# Patient Record
Sex: Female | Born: 1960 | Race: Black or African American | Hispanic: No | Marital: Married | Smoking: Never smoker
Health system: Southern US, Community
[De-identification: ages and names within clinical notes are randomized; demographics above are authoritative.]

## PROBLEM LIST (undated history)

## (undated) DIAGNOSIS — K219 Gastro-esophageal reflux disease without esophagitis: Secondary | ICD-10-CM

## (undated) DIAGNOSIS — F419 Anxiety disorder, unspecified: Secondary | ICD-10-CM

## (undated) DIAGNOSIS — E119 Type 2 diabetes mellitus without complications: Secondary | ICD-10-CM

## (undated) DIAGNOSIS — G4730 Sleep apnea, unspecified: Secondary | ICD-10-CM

## (undated) DIAGNOSIS — I10 Essential (primary) hypertension: Secondary | ICD-10-CM

## (undated) HISTORY — PX: ABDOMINAL HYSTERECTOMY: SHX81

## (undated) HISTORY — DX: Type 2 diabetes mellitus without complications: E11.9

## (undated) HISTORY — PX: ANTERIOR CRUCIATE LIGAMENT REPAIR: SHX115

## (undated) HISTORY — DX: Sleep apnea, unspecified: G47.30

## (undated) HISTORY — DX: Anxiety disorder, unspecified: F41.9

## (undated) HISTORY — DX: Essential (primary) hypertension: I10

## (undated) HISTORY — DX: Gastro-esophageal reflux disease without esophagitis: K21.9

---

## 1999-03-24 ENCOUNTER — Other Ambulatory Visit: Admission: RE | Admit: 1999-03-24 | Discharge: 1999-03-24 | Payer: Self-pay | Admitting: Gynecology

## 2000-02-08 ENCOUNTER — Emergency Department (HOSPITAL_COMMUNITY): Admission: EM | Admit: 2000-02-08 | Discharge: 2000-02-09 | Payer: Self-pay | Admitting: *Deleted

## 2000-03-30 ENCOUNTER — Other Ambulatory Visit: Admission: RE | Admit: 2000-03-30 | Discharge: 2000-03-30 | Payer: Self-pay | Admitting: Gynecology

## 2000-04-20 ENCOUNTER — Encounter (INDEPENDENT_AMBULATORY_CARE_PROVIDER_SITE_OTHER): Payer: Self-pay

## 2000-04-20 ENCOUNTER — Other Ambulatory Visit: Admission: RE | Admit: 2000-04-20 | Discharge: 2000-04-20 | Payer: Self-pay | Admitting: Gynecology

## 2001-03-30 ENCOUNTER — Other Ambulatory Visit: Admission: RE | Admit: 2001-03-30 | Discharge: 2001-03-30 | Payer: Self-pay | Admitting: Gynecology

## 2001-04-10 ENCOUNTER — Encounter (INDEPENDENT_AMBULATORY_CARE_PROVIDER_SITE_OTHER): Payer: Self-pay | Admitting: Specialist

## 2001-04-10 ENCOUNTER — Ambulatory Visit (HOSPITAL_COMMUNITY): Admission: RE | Admit: 2001-04-10 | Discharge: 2001-04-10 | Payer: Self-pay | Admitting: Gynecology

## 2002-01-12 ENCOUNTER — Ambulatory Visit (HOSPITAL_BASED_OUTPATIENT_CLINIC_OR_DEPARTMENT_OTHER): Admission: RE | Admit: 2002-01-12 | Discharge: 2002-01-12 | Payer: Self-pay | Admitting: *Deleted

## 2002-04-09 ENCOUNTER — Other Ambulatory Visit: Admission: RE | Admit: 2002-04-09 | Discharge: 2002-04-09 | Payer: Self-pay | Admitting: Family Medicine

## 2002-06-27 ENCOUNTER — Encounter (INDEPENDENT_AMBULATORY_CARE_PROVIDER_SITE_OTHER): Payer: Self-pay

## 2002-06-28 ENCOUNTER — Inpatient Hospital Stay (HOSPITAL_COMMUNITY): Admission: AD | Admit: 2002-06-28 | Discharge: 2002-06-30 | Payer: Self-pay | Admitting: Obstetrics and Gynecology

## 2003-05-03 ENCOUNTER — Other Ambulatory Visit: Admission: RE | Admit: 2003-05-03 | Discharge: 2003-05-03 | Payer: Self-pay | Admitting: Family Medicine

## 2004-12-22 ENCOUNTER — Ambulatory Visit (HOSPITAL_COMMUNITY): Admission: RE | Admit: 2004-12-22 | Discharge: 2004-12-22 | Payer: Self-pay | Admitting: *Deleted

## 2004-12-31 ENCOUNTER — Encounter: Admission: RE | Admit: 2004-12-31 | Discharge: 2005-03-31 | Payer: Self-pay | Admitting: *Deleted

## 2005-01-08 ENCOUNTER — Ambulatory Visit (HOSPITAL_COMMUNITY): Admission: RE | Admit: 2005-01-08 | Discharge: 2005-01-08 | Payer: Self-pay | Admitting: *Deleted

## 2005-02-26 ENCOUNTER — Ambulatory Visit (HOSPITAL_COMMUNITY): Admission: RE | Admit: 2005-02-26 | Discharge: 2005-02-26 | Payer: Self-pay | Admitting: *Deleted

## 2005-04-14 ENCOUNTER — Ambulatory Visit: Payer: Self-pay | Admitting: Pulmonary Disease

## 2005-05-31 HISTORY — PX: OTHER SURGICAL HISTORY: SHX169

## 2005-07-15 ENCOUNTER — Encounter: Admission: RE | Admit: 2005-07-15 | Discharge: 2005-10-13 | Payer: Self-pay | Admitting: *Deleted

## 2005-08-03 ENCOUNTER — Observation Stay (HOSPITAL_COMMUNITY): Admission: RE | Admit: 2005-08-03 | Discharge: 2005-08-06 | Payer: Self-pay | Admitting: *Deleted

## 2005-10-28 ENCOUNTER — Encounter: Admission: RE | Admit: 2005-10-28 | Discharge: 2005-10-28 | Payer: Self-pay | Admitting: *Deleted

## 2005-12-13 ENCOUNTER — Encounter: Admission: RE | Admit: 2005-12-13 | Discharge: 2006-03-13 | Payer: Self-pay | Admitting: *Deleted

## 2006-03-03 ENCOUNTER — Other Ambulatory Visit: Admission: RE | Admit: 2006-03-03 | Discharge: 2006-03-03 | Payer: Self-pay | Admitting: Family Medicine

## 2006-03-08 ENCOUNTER — Encounter: Admission: RE | Admit: 2006-03-08 | Discharge: 2006-06-06 | Payer: Self-pay | Admitting: *Deleted

## 2006-08-02 ENCOUNTER — Encounter: Admission: RE | Admit: 2006-08-02 | Discharge: 2006-10-31 | Payer: Self-pay | Admitting: *Deleted

## 2006-11-21 ENCOUNTER — Emergency Department (HOSPITAL_COMMUNITY): Admission: EM | Admit: 2006-11-21 | Discharge: 2006-11-21 | Payer: Self-pay | Admitting: Emergency Medicine

## 2007-04-03 ENCOUNTER — Other Ambulatory Visit: Admission: RE | Admit: 2007-04-03 | Discharge: 2007-04-03 | Payer: Self-pay | Admitting: Family Medicine

## 2007-05-11 ENCOUNTER — Encounter: Admission: RE | Admit: 2007-05-11 | Discharge: 2007-05-11 | Payer: Self-pay | Admitting: Family Medicine

## 2008-05-13 ENCOUNTER — Encounter: Admission: RE | Admit: 2008-05-13 | Discharge: 2008-05-13 | Payer: Self-pay | Admitting: Family Medicine

## 2010-04-29 ENCOUNTER — Encounter: Admission: RE | Admit: 2010-04-29 | Discharge: 2010-04-29 | Payer: Self-pay | Admitting: Family Medicine

## 2010-06-21 ENCOUNTER — Encounter: Payer: Self-pay | Admitting: Family Medicine

## 2010-10-16 NOTE — Discharge Summary (Signed)
[name], [name]                         ACCOUNT NO.:  [id number]   MEDICAL RECORD NO.:  [id number]                   PATIENT TYPE:  INP   LOCATION:  9311                                 FACILITY:  [facility]   PHYSICIAN:  Maxie Better, M.D.            DATE OF BIRTH:  [birth year]   DATE OF ADMISSION:  06/27/2002  DATE OF DISCHARGE:  06/30/2002                                 DISCHARGE SUMMARY   ADMISSION DIAGNOSES:  1. Severe dysmenorrhea.  2. Dysfunctional uterine bleeding.   DISCHARGE DIAGNOSES:  1. Uterine fibroids.  2. Dysfunctional uterine bleeding.  3. Endometrial polyps.  4. Severe dysmenorrhea.  5. Iron-deficiency anemia.   PROCEDURE:  Examination under anesthesia, diagnostic laparoscopy,  exploratory laparotomy, total abdominal hysterectomy.   HISTORY OF PRESENT ILLNESS:  A 50 year old G0 female with severe  dysmenorrhea and dysfunctional uterine bleeding who was admitted for  possible total abdominal hysterectomy, possible laparoscopic assisted  vaginal hysterectomy.  The patient's history is notable for multiple medical  problems.  She has also had a hysteroscopic resection of submucosal fibroid  in November of 2002.   HOSPITAL COURSE:  The patient was admitted to [facility].  She was  taken to the operating room where examination under anesthesia was  performed.  Diagnostic laparoscopy was done.  Uterus was about eight weeks'  size, anteverted, irregular with descensus.  Right and left tubes were  normal.  Left ovary with a small cyst, right ovary was normal.  Normal  appendix, normal liver edge.  Decision was made to proceed with abdominal  route.  Therefore, the patient underwent a total abdominal hysterectomy  with preservation of ovaries.  Final pathology showed cervix without any  pathological abnormalities, benign secretory endometrial polyps,  leiomyomata, uterus was 175 g.   The patient's postoperative course was notable for being afebrile,  slowing  of bowel function, subsequent passage of flatus after suppository.   By postoperative day #3, the patient felt well to go home.  She had been  tolerating a regular diet, voiding without difficulty.  Her incision showed  no erythema, induration or exudate.  Her CBC on postoperative day #1 showed  hemoglobin 8.3, preoperative hemoglobin was 9.5, platelet count was 305,000,  white count 11.3.   DISPOSITION:  Home.   CONDITION ON DISCHARGE:  Stable.   DISCHARGE MEDICATIONS:  1. Dilaudid 2 mg one to two tablets every two to four hours p.r.n. pain.  2. Motrin 800 mg p.o. q.6-8h. p.r.n. pain.  3. Over-the-counter iron supplement one p.o. b.i.d.   FOLLOW UP:  A follow-up appointment is for staple removal on Wednesday, and  will follow up six weeks postoperatively at [facility].   DISCHARGE INSTRUCTIONS:  Nothing per vagina for four to six weeks.  No  strenuous bowel movements.  Call for temperature greater than or equal to  100.4.  Call if increased incisional appearing redness or drainage from  incision site,  severe abdominal pain, nausea or vomiting.  No heavy lifting  or driving for two weeks.  No added salt diet.                                               Maxie Better, M.D.    [place]/MEDQ  D:  07/30/2002  T:  07/30/2002  Job:  [id number]

## 2010-10-16 NOTE — Discharge Summary (Signed)
[name], [name]               ACCOUNT NO.:  [id number]   MEDICAL RECORD NO.:  [id number]           PATIENT TYPE:   LOCATION:                               FACILITY:  [facility]   PHYSICIAN:  Alfonse Ras, MD   DATE OF BIRTH:  August 26, 1960   DATE OF ADMISSION:  08/06/2005  DATE OF DISCHARGE:  08/09/2005                                 DISCHARGE SUMMARY   ADMISSION DIAGNOSIS:  Morbid obesity.   DISCHARGE DIAGNOSIS:  Morbid obesity.   CONDITION ON DISCHARGE:  Good and improved.   DISPOSITION:  Discharged to home.   MEDICATIONS:  Roxicet elixir at the time of discharge.   HOSPITAL COURSE:  The patient was brought to the hospital after having home  bowel prep and underwent laparoscopic Roux-en-Y gastric bypass.  She did  well and was watched in the step-down unit on CPAP.  Her swallow on  postoperative day #1 showed no evidence of leak and good flow through the  anastomosis.  She was transferred to the floor and started on a diet.  She  had some difficulty with urination on postoperative day #2 which resolved by  the following day and she was ready for discharge home.      Alfonse Ras, MD  Electronically Signed     KRE/MEDQ  D:  08/27/2005  T:  08/30/2005  Job:  [id number]

## 2010-10-16 NOTE — H&P (Signed)
[facility]  Patient:    [name], [name] Visit Number: [id number] MRN: [id number]          Service Type: Attending:  Nadyne Coombes. Fontaine, M.D. Dictated by:   Nadyne Coombes. Fontaine, M.D. Adm. Date:  04/10/01                           History and Physical  CHIEF COMPLAINT:              Irregular bleeding.  HISTORY OF PRESENT ILLNESS:   A 50 year old G0 with history of irregular menses over the last several months.  Patient does have history of prior simple hyperplasia of the endometrium with follow-up negative biopsy.  Current evaluation includes a normal prolactin, normal TSH, a CBC with a hematocrit of 33, and an ultrasound which shows small myomas, right and left ovaries grossly normal; attempted sonohysterogram unable to achieve due to inability to place the catheter.  The patient is admitted at this time for hysteroscopy and D&C with possible resection of any intracavitary defects.  PAST MEDICAL HISTORY:         Includes hypertension and osteoarthritis.  PAST SURGICAL HISTORY:        Includes a uvuloplasty, and a laparoscopy in 1994.  ALLERGIES:                    SULFA.  REVIEW OF SYSTEMS:            Noncontributory.  SOCIAL HISTORY:               Noncontributory.  FAMILY HISTORY:               Noncontributory.  CURRENT MEDICATIONS:          1. Monopril 5 mg.                               2. Nasacort two puffs each nostril.                               3. Clarinex one p.o. q.d.                               4. Sarafem 10 mg p.o. q.d.                               5. Fibercon p.r.n.  ADMISSION PHYSICAL EXAMINATION:  VITAL SIGNS:                  Afebrile, vital signs are stable.  HEENT:                        Normal.  LUNGS:                        Clear.  CARDIAC:                      Regular rate without rubs, murmurs, or gallops.  ABDOMEN:                      Benign.  PELVIC:  External, BUS, vagina  normal.  Cervix grossly normal.  Uterus normal size, nontender.  Adnexa without masses or tenderness.  ASSESSMENT:                   A 50 year old G0 with history of irregular menses, past history of endometrial hyperplasia, follow-up biopsy in 2001 benign secretory endometrium with several-month history now of irregular menses.  Ultrasound shows some distortion from submucous myomas, question intracavitary versus intramural.  Attempts at sonohysterogram unsuccessful and patient is admitted now for hysteroscopy D&C, possible resection of any intracavitary defects.  The risks, benefits, indications, and alternatives were reviewed with the patient to include review of hysteroscopy D&C with the expected intraoperative/postoperative courses.  The risks of infection; bleeding; hemorrhage; transfusion; perforation; damage to internal organs including bowel, bladder, ureters, vessels, and nerves necessitating major exploratory reparative surgeries and future reparative surgeries including ostomy formation was all discussed with her.  The patients questions are answered to her satisfaction and she is ready to proceed with surgery. Dictated by:   Nadyne Coombes. Fontaine, M.D. Attending:  Nadyne Coombes. Fontaine, M.D. DD:  04/07/01 TD:  04/07/01 Job: [id number]

## 2010-10-16 NOTE — Op Note (Signed)
[name], [name]               ACCOUNT NO.:  [id number]   MEDICAL RECORD NO.:  [id number]          PATIENT TYPE:  INP   LOCATION:  0151                         FACILITY:  [facility]   PHYSICIAN:  Alfonse Ras, MD   DATE OF BIRTH:  December 31, 1960   DATE OF PROCEDURE:  08/03/2005  DATE OF DISCHARGE:                                 OPERATIVE REPORT   PREOPERATIVE DIAGNOSIS:  Morbid obesity, obstructive sleep apnea,  gastroesophageal reflux disease, and hypertension.   POSTOPERATIVE DIAGNOSIS:  Morbid obesity, obstructive sleep apnea,  gastroesophageal reflux disease, and hypertension.   PROCEDURE:  Laparoscopic Roux-en-Y gastric bypass, left facing Roux limb.   SURGEON:  Alfonse Ras, MD.   ASSISTANT:  Wenda Low, MD.   ANESTHESIA:  General.   DESCRIPTION OF PROCEDURE:  The patient was taken to the operating room,  placed in a supine position and after adequate general anesthesia was  induced using an endotracheal tube, the abdomen was prepped and prepped and  draped in the normal sterile fashion. Using a 12 mm Optiview in the left  upper quadrant under direct vision, peritoneal access was obtained.  Pneumoperitoneum was obtained. Additional 12 and 5 mm trocars were placed  throughout the abdomen. There was a few omental adhesions which were taken  down with the harmonic scalpel to the anterior abdominal wall. The ligament  of Treitz was identified and 40 cm distal to that it was transected using a  white load GIA stapling device. The distal limb was tagged with a Penrose  drain and then counting 100 cm further a side-to-side jejunojejunostomy was  performed in the standard fashion using a GIA 45 mm load. The enterotomy was  closed with a running 2-0 Vicryl suture. It was inspected and then  reinforced with Tisseel. The mesenteric defect was closed with a running 2-0  silk suture.   I then turned my attention to the upper abdomen and the patient was placed  in reverse  Trendelenburg. A Nathanson liver retractor was then placed and  the left lateral segment of the liver was retracted anteriorly. The angle of  Hiss was bluntly and sharply dissected. An area on the lesser curve  approximately 4 cm distal to the EG junction was identified and using sharp  and blunt dissection the lesser sac was entered. Using serial firings of the  first gray and then blue load 60 mm GIA the pouch was created. Ewald was  placed down through the EG junction. Satisfied with the pouch, I then  oversewed the staple line of the remnant with a running locking 2-0 silk  suture. The Roux limb was then brought up in a left facing position without  any tension. A side-to-side gastrojejunostomy was performed in the standard  fashion using a 45 mm GIA blue load stapling device. The posterior layer had  previously been placed with a 2-0 Vicryl suture. The enterotomy and  gastrotomy were closed with a running 2-0 Vicryl suture and an anterior  serosal layer was then accomplished with a 2-0 Vicryl. The Roux limb was  then clamped and upper abdomen was  irrigated. Upper endoscopy is performed  by Dr. Daphine Deutscher which showed no evidence of leak in patency of the  anastomosis. This will be dictated in a separate report. The [facility] liver  retractor was removed. The staple line was reinforced with Tisseel. Trocars  were removed. Pneumoperitoneum was released. The skin incisions were closed  with staples. The patient tolerated the procedure well and went to PACU in  good condition.      Alfonse Ras, MD  Electronically Signed     KRE/MEDQ  D:  08/03/2005  T:  08/04/2005  Job:  [id number]

## 2010-10-16 NOTE — Op Note (Signed)
[name], [name]               ACCOUNT NO.:  [id number]   MEDICAL RECORD NO.:  [id number]          PATIENT TYPE:  INP   LOCATION:  0151                         FACILITY:  [facility]   PHYSICIAN:  Thornton Park. Daphine Deutscher, MD  DATE OF BIRTH:  30-May-1961   DATE OF PROCEDURE:  08/03/2005  DATE OF DISCHARGE:                                 OPERATIVE REPORT   PROCEDURE:  Upper endoscopy at the completion of laparoscopic Roux-en-Y  gastric bypass.  Endoscopist:  Wenda Low, MD, FACS   This 50 year old black female had undergone the aforementioned operation. I  passed the endoscope from above without difficulty. I measured the  esophagogastric junction at approximately the 37-38 cm and the anastomosis  was identified to be a pouch of about 4-5 cm. I entered the pouch and saw no  evidence of bleeding. I insufflated and she clamped while she was observing  on the inside and no evidence of bubbles or leak. I then took some pictures  and then decompressed the pouch and withdrew the scope. No apparent  complications.      Thornton Park Daphine Deutscher, MD  Electronically Signed     MBM/MEDQ  D:  08/03/2005  T:  08/04/2005  Job:  [id number]

## 2010-10-16 NOTE — Op Note (Signed)
[facility]  Patient:    [name], [name] Visit Number: [id number] MRN: [id number]          Service Type: DSU Location: [facility] Attending Physician:  Merrily Pew Proc. Date: 04/10/01 Admit Date:  04/10/2001                             Operative Report  PREOPERATIVE DIAGNOSES:       1. Menorrhagia.                               2. Leiomyomata.  POSTOPERATIVE DIAGNOSIS:      Submucous myoma.  PROCEDURE:                    Hysteroscopic myomectomy, D&C.  SURGEON:                      Timothy P. Fontaine, M.D.  ANESTHESIA:                   General.  SPECIMENS:                    1. Submucous myoma fragments.                               2. EMC:  ESTIMATED BLOOD LOSS:         Minimal.  COMPLICATIONS:                None.  SORBITOL DISCREPANCY:         Approximately 100-150 cc.  FINDINGS:                     Submucous myoma right lateral uterine wall, resected to the level of the endometrium.  D&C performed.  Hysteroscopy otherwise normal, noting right and left tubal ostia.  Fundus, anteroposterior uterine surfaces, lower uterine segment, and the cervical canal all inspected.  DESCRIPTION OF PROCEDURE:     The patient was taken to the operating room and underwent general anesthesia, was placed in low dorsal lithotomy position, received a perineal and vaginal preparation with Betadine solution.  Bladder emptied with in-and-out Foley catheterization, the patient draped in the usual fashion.  Cervix was visualized and grasped with a single-tooth tenaculum, gently dilated to admit the operative hysteroscope, and hysteroscopy was performed with findings noted above.  Through repetitive passes with the right angle resectoscopic loop, the myoma was resected to the level of the surrounding endometrium.  A sharp curettage was then performed. Re-hysteroscopy showed good distension, a normal-appearing cavity, no evidence of perforation.  The  instruments were all removed.  Adequate hemostasis visualized, the patient placed in supine position, awakened without difficulty, and taken to recovery room in good condition having tolerated the procedure well.  The sorbitol discrepancy was approximately 100-150 cc accounting for some sorbitol on the floor.  Note that during the procedure and use of the tenaculum, there was little or no descent of the uterus with traction. Attending Physician:  Merrily Pew DD:  04/10/01 TD:  04/10/01 Job: [id number]

## 2010-10-16 NOTE — Op Note (Signed)
[name], [name]                         ACCOUNT NO.:  [id number]   MEDICAL RECORD NO.:  [id number]                   PATIENT TYPE:  OBV   LOCATION:  9311                                 FACILITY:  [facility]   PHYSICIAN:  Maxie Better, M.D.            DATE OF BIRTH:  30-May-1961   DATE OF PROCEDURE:  06/27/2002  DATE OF DISCHARGE:                                 OPERATIVE REPORT   PREOPERATIVE DIAGNOSIS:  Severe dysmenorrhea and dysfunctional uterine  bleeding.   POSTOPERATIVE DIAGNOSIS:  Severe dysmenorrhea, dysfunctional uterine  bleeding, uterine fibroids.   PROCEDURE:  Examination under anesthesia, diagnostic laparoscopy,  exploratory laparotomy, total abdominal hysterectomy.   ANESTHESIA:  General.   SURGEON:  Maxie Better, M.D.   ASSISTANT:  Cordelia Pen A. Rosalio Macadamia, M.D.   INDICATIONS:  This is a 50 year old gravida 0 female.  Her last menstrual  period was 05/13/02 with severe dysmenorrhea and dysfunctional uterine  bleeding, who now presents for definitive surgical management.  The risks  and benefits of the procedure had been explained to the patient.  Consent  was signed, and the patient was transferred to the operating room.   PROCEDURE:  Under adequate general anesthesia, the patient was placed in the  dorsolithotomy position.  Examination under anesthesia revealed an eight-  week size, anteverted uterus.  No adnexal masses could be appreciated.  The  patient was sterilely prepped and draped in the usual fashion.  An  indwelling Foley catheter was sterilely placed.  A bivalved speculum was  placed in the vagina.  A single-tooth tenaculum was placed on the anterior  lip of the cervix.  An acorn cannula was introduced into the cervical os and  attached to the tenaculum for manipulation of the uterus.  Attention was  then turned to the abdomen where 0.25% Marcaine was injected  infraumbilically.  An infraumbilical incision was then made.  The Veress  needle  was introduced into the abdominal cavity.  The patient was tested  with normal saline.  Opening pressure of 11 was noted.  2.5 liters of carbon  dioxide were insufflated.  The Veress needle was removed.  A 10 mm  disposable trocar was introduced into the abdominal cavity without incident.  A lighted video laparoscope was introduced through that port.  The patient  was then placed in Trendelenburg, and inspection of the abdomen and pelvis  was done.  A normal appendix was noted.  A normal liver was noted.  A  suprapubic incision was then made, and under direct visualization, a 5 mm  port was introduced.  Through that lower port, a probe was then utilized to  inspect the pelvis.  It was noted that the patient's pelvis did not show any  pelvic endometriosis.  Her uterus was irregular in contour.  In particular,  the posterior aspect of the uterus contained subserosal fibroids.  The right  ovary was normal.  The left ovary had  a small-appearing cyst.  With  manipulation of the uterus and the size of the uterus as noted on  laparoscopy, as well as the inability to show descensus on vaginal exam, a  decision was then made to convert the procedure to a laparotomy.  The  infraumbilical and the suprapubic ports were then removed.  The patient  remained in the dorsolithotomy position, and using continued sterile  technique, a Pfannenstiel skin incision was made after a marking pen was  utilized to outline the planned incision.  0.25% Marcaine was injected along  the planned line.  A Pfannenstiel skin incision was then made and carried  onto the rectus fascia.  The rectus fascia was incised in the midline and  extended transversely.  The rectus fascia was then bluntly and with cautery  dissected off the rectus muscle in a superior and inferior fashion. The  rectus muscle was split in the midline.  The parietal peritoneum was entered  bluntly and extended superiorly and inferiorly.  Small bleeders along  the  path were cauterized.  A self-retaining retractor was then placed. The  bowels were packed upwardly.  Using long Kelly's, the utero-ovarian  ligaments were bilaterally clamped for traction on the uterus.  The right  round ligament was brought into the field.  A 0 Vicryl figure-of-eight  suture was then made around the right round ligament.  The right round  ligament was severed using cautery.  The anterior leaf of the broad ligament  was opened.  The posterior leaf of the broad ligament was then opened.  The  right utero-ovarian ligament was doubly clamped, cut, suture ligated, and  free tied with 0 Vicryl suture.  This same procedure was performed on the  contralateral side with the round ligament and the left utero-ovarian  ligament.  The anterior leaf of the broad ligament was opened on the left,  and the vesicouterine peritoneum was developed.  The bladder was then  bluntly dissected off the lower uterine segment.  A self-retaining Balfour  retractor, which had been placed had included a bladder retractor.  The  uterine vessels were bilaterally skeletonized, clamped bilaterally, cut, and  suture ligated with 0 Vicryl.  The fundus of the uterus was then severed  from its cervical attachment.  The cervix was then grasped and placed on  traction.  The cardinal ligaments were then bilaterally clamped, cut, and  suture ligated with 0 Vicryl.  The utero-ovarian ligaments were bilaterally  clamped, cut, and suture ligated with 0 Vicryl.  This was continued until  the cervicovaginal junction was then reached, at which time the cervix was  then circumferentially removed from its vaginal attachments.  The vaginal  angles were placed with 0 Vicryl.  The vaginal cuff was reefed with 0 Vicryl  suture and then closed with a mattress placement of 0 Vicryl suture.  The  bladder throughout the procedure continued to be displaced inferiorly. Small bleeding along the left aspect of the bladder was  cauterized.  Inspection of the pedicles was noted for some bleeding along the peritoneal  edge and the retroperitoneal space, which were then cauterized.  The ovarian  pedicles were tied to the round ligaments bilaterally.  The abdomen was  irrigated and suctioned of debris.  Good hemostasis was subsequently noted,  at which time the packings were removed.  The self-retaining retractor was  then subsequently removed.  The rectus muscle was inspected and small  bleeders were cauterized.  The parietal and vesicouterine peritoneum were  now closed.  The undersurface of the rectus fascia was inspected.  Small  bleeders were cauterized.  The rectus fascia was then closed with 0 Vicryl  x2.  The subcutaneous area was irrigated and small bleeders cauterized.  The  skin was approximated using Ethicon staples.  Prior to starting the  abdominal portion of the procedure, the instruments from the vagina had been  removed.  The infraumbilical incision was closed with 4-0 Vicryl suture.  Specimen labeled uterus was sent to pathology.  The estimated blood loss was  200 cc.  Urine output was 225 cc clear yellow urine.  Intraoperative fluid  was 2800 cc crystalloid.  Sponge and instrument counts x2 were correct.  Complications were none.  The patient tolerated the procedure well and was  transferred to the recovery room in stable condition.                                               Maxie Better, M.D.    [place]/MEDQ  D:  06/27/2002  T:  06/28/2002  Job:  [id number]

## 2010-10-16 NOTE — H&P (Signed)
NAME:  [name], POULLARD                         ACCOUNT NO.:  [id number]   MEDICAL RECORD NO.:  [id number]                   PATIENT TYPE:  AMB   LOCATION:  [facility]                                  FACILITY:  [facility]   PHYSICIAN:  Maxie Better, M.D.            DATE OF BIRTH:  1960/11/07   DATE OF PROCEDURE:  DATE OF DISCHARGE:                      STAT - MUST CHANGE TO CORRECT WORK TYPE   CHIEF COMPLAINT:  Dysfunctional uterine bleeding, severe dysmenorrhea.   HISTORY OF PRESENT ILLNESS:  This is a 50 year old G0 married black female  last menstrual period 05/13/02 who has now been admitted for laparoscopic-  assisted vaginal hysterectomy, possible total abdominal hysterectomy,  secondary to severe dysmenorrhea and dysfunctional uterine bleeding. The  patient was a previous patient of Dr. Colin Broach. She has had a long  history of irregular heavy and painful menses, not responsive to birth  control pills or nonsteroidals. The patient had undergone a hysteroscopic  resection of a submucosal fibroid in 11/02 which did not alleviate her  symptoms. 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 She denied any history of pelvic endometriosis. Ultrasound done  at Dr. Reynold Bowen office in 8/02 had shown a 2.6-cm submucosal fibroid, the  left ovary behind the uterus but otherwise normal size. The patient's  endometrial biopsy in 6/99 did show focal simple endometrial hyperplasia.  The patient desires no future attempt at conception. Her cycles have lasted  greater than seven days when they do occur with passive of clots and severe  cramping. Endometrial biopsy done on 06/14/02 showed benign proliferative  endometrium. No hyperplasia seen. Last Pap in 11/03 was normal. The patient  now presents for surgical management of her problem.   ALLERGIES:  Allergies to sulfa, lima beans.   MEDICATIONS:  1. Nexium 40 mg p.o. daily.  2. Sarafem 10 mg p.o.  daily.  3. Lisinopril.   PAST MEDICAL HISTORY:  1. Sleep apnea, uses CPAP.  2. PMS.  3. Gastroesophageal reflux disorder.  4. Chronic hypertension.  5. Exogenous obesity.   PAST SURGICAL HISTORY:  1. Laparoscopy in 1994.  2. D&C, hysteroscopic resection of submucosal fibroids in 11/02.  3. Uvulopalatoplasty.   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   FAMILY HISTORY:  Diabetes in mother and father. Hypertension in mother and  father and brother.   REVIEW OF SYMPTOMS:  Negative except as noted in history of present illness  as well as sinus related symptoms.   PHYSICAL EXAMINATION:  GENERAL:  Well-developed, well-nourished, obese,  black female in no acute distress.  VITAL SIGNS:  Blood pressure 112/70, weight 236 pounds, height 5 foot 2-1/4  inch.  SKIN:  Shows no lesions.  HEENT:  Anicteric sclerae. Pink conjunctivae. Oropharynx negative.  HEART:  Regular rate and rhythm without murmurs.  LUNGS:  Clear to auscultation.  NECK:  Supple. No palpable thyroid.  BREAST:  Soft, nontender,  no palpable mass.  BACK:  No CVA tenderness.  NODES:  No axillary, supraclavicular, or inguinal nodes palpable.  ABDOMEN:  Obese, soft, nontender.  PELVIC EXAM:  Vulva showed no lesions. Vagina showed no discharge or  lesions. Cervix was high in the vault. Uterus was anteverted, deviated to  the left, normal size. Adnexa had no palpable mass but limited by the  patient's body habitus.  RECTAL EXAM:  Deferred.  EXTREMITIES:  No edema.   IMPRESSION:  Severe dysmenorrhea, dysfunctional uterine bleeding. Possible  etiology includes adenomyosis along with uterine fibroids.   PLAN:  Admission. Laparoscopic-assisted vaginal hysterectomy. Possible total  abdominal hysterectomy. Antibiotic prophylaxis. Antiembolic stockings.  Resumption of the CPAP postoperatively. Resumption of her antihypertensive  medicines postoperatively. Risk of the procedure was discussed  with the  patient including but not limited to:  Inability to perform a laparoscopic-  assisted vaginal hysterectomy due to multiple reasons including lack of  descent of the uterus, infection, bleeding, possible need for blood  transfusion, fistula formation, possible loss of ovaries- pros and cons of  preservation of ovarian function was discussed including possible need for  surgery in the future for ovarian disease including ovarian cancer, internal  scar tissue, injury to surrounding organ structures such as the  bladder/bowel/uterine, risks of blood transfusion including but not limited  to acute reaction/ hepatitis transmission/HIV transmission was discussed,  bowel obstruction due to adhesions. Postoperative care and criteria for  discharge were reviewed. All questions were answered.                                               Maxie Better, M.D.    Marcus/MEDQ  D:  06/27/2002  T:  06/27/2002  Job:  [id number]

## 2012-04-03 ENCOUNTER — Other Ambulatory Visit: Payer: Self-pay | Admitting: Family Medicine

## 2012-04-03 DIAGNOSIS — Z1231 Encounter for screening mammogram for malignant neoplasm of breast: Secondary | ICD-10-CM

## 2012-04-28 ENCOUNTER — Ambulatory Visit
Admission: RE | Admit: 2012-04-28 | Discharge: 2012-04-28 | Disposition: A | Discharge status: Home / Self Care | Payer: BC Managed Care – PPO | Source: Ambulatory Visit | Attending: Family Medicine | Admitting: Family Medicine

## 2012-04-28 DIAGNOSIS — Z1231 Encounter for screening mammogram for malignant neoplasm of breast: Secondary | ICD-10-CM

## 2012-09-04 ENCOUNTER — Other Ambulatory Visit: Payer: Self-pay | Admitting: Gastroenterology

## 2012-09-04 DIAGNOSIS — R1013 Epigastric pain: Secondary | ICD-10-CM

## 2012-09-05 ENCOUNTER — Ambulatory Visit
Admission: RE | Admit: 2012-09-05 | Discharge: 2012-09-05 | Disposition: A | Discharge status: Home / Self Care | Payer: BC Managed Care – PPO | Source: Ambulatory Visit | Attending: Gastroenterology | Admitting: Gastroenterology

## 2012-09-05 DIAGNOSIS — R1013 Epigastric pain: Secondary | ICD-10-CM

## 2012-09-07 ENCOUNTER — Other Ambulatory Visit: Payer: Self-pay | Admitting: Gastroenterology

## 2012-09-08 ENCOUNTER — Other Ambulatory Visit: Payer: BC Managed Care – PPO

## 2013-02-12 ENCOUNTER — Other Ambulatory Visit: Payer: Self-pay | Admitting: Family Medicine

## 2013-02-12 ENCOUNTER — Ambulatory Visit
Admission: RE | Admit: 2013-02-12 | Discharge: 2013-02-12 | Disposition: A | Discharge status: Home / Self Care | Payer: BC Managed Care – PPO | Source: Ambulatory Visit | Attending: Family Medicine | Admitting: Family Medicine

## 2013-02-12 DIAGNOSIS — R05 Cough: Secondary | ICD-10-CM

## 2013-12-17 ENCOUNTER — Other Ambulatory Visit: Payer: Self-pay

## 2013-12-17 ENCOUNTER — Ambulatory Visit
Admission: RE | Admit: 2013-12-17 | Discharge: 2013-12-17 | Disposition: A | Discharge status: Home / Self Care | Payer: BC Managed Care – PPO | Source: Ambulatory Visit

## 2013-12-17 DIAGNOSIS — Z1231 Encounter for screening mammogram for malignant neoplasm of breast: Secondary | ICD-10-CM

## 2015-04-21 ENCOUNTER — Encounter: Payer: Self-pay | Admitting: Family Medicine

## 2015-04-21 ENCOUNTER — Ambulatory Visit (INDEPENDENT_AMBULATORY_CARE_PROVIDER_SITE_OTHER): Payer: BC Managed Care – PPO | Admitting: Family Medicine

## 2015-04-21 NOTE — Patient Instructions (Addendum)
1. Eat at least 3 REAL meals and 1-2 snacks per day.  Aim for no more than 5 hours between eating.  Eat breakfast within one hour of getting up.   - A REAL meal includes some protein, some starch, and veg's and/or fruit.    - Set aside some time on the weekend to plan the week's meals and snacks, and to shop as needed.    - Make a list of 7-10 meals that taste good, are relatively quick and easy to prepare, and that meet your nutritional needs.  Use this as a basis for shopping, so you can make one of these meals any time.  Bring your list to your follow-up appointment for review.   2. Obtain twice as many veg's as protein or carbohydrate foods for both lunch and dinner. 3. Make sure there are plenty of good alternatives to the chips and sweets, i.e., unsalted nuts, fruit, yogurt, string cheese, vegetables like carrots, tomatoes, cereal with milk.  4. Keep a daily food record: What, how much, and what time?  The greatest value of your food record is in reviewing it!  If you see choices you aren't happy with, stop to figure out why you made those choices.  THIS IS A KEY COMPONENT OF YOUR FOOD RECORD, AND ITS ABILITY TO HELP YOU SUCCEED!  (You may want to use your food record as a bit of a journal as well, eg., your thoughts about why you chose what you chose.)  When you come back for follow-up, we'll discuss non-hunger eating and getting through the holidays well.

## 2015-04-21 NOTE — Progress Notes (Signed)
Medical Nutrition Therapy:  Appt start time: 0900 end time:  1000.  Assessment:  Primary concerns today: Weight management (E66.01).   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  She said she realizes now that she had wanted the bypass to do for her what she needs to do for herself.  She feels she is addicted to sugar and carb's like bread, chips, potatoes, candy, and crackers.  She lives with her husband, 18-YO son, and mother-in-law.  Her husband and son also like sweets and chips, and soda, so [name] anticipates that these will always be in the house.    Barriers to learning/adherence to lifestyle change: "Addiction" to carb's; likely inadequate sleep.     Usual eating pattern includes 2-3 meals and 5-6 snacks per day. Frequent foods and beverages include sweet desserts, [place] fries, salads with no dressing, water, coffee with 4 T flavored creamer & 1-2  t sugar, eggs, fast food 1-2 X wk, soda 2-3 X wk.  Eats rest/takeout food ~2 X wk. Avoided foods include mayonnaise, salad drsng, beets, liver, lima beans (allergic), and Br sprouts.   Usual physical activity includes very little currently.  Was walking and working out at work ([employer]), but time constraints have precluded workouts.  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  Hopes to finish her degree by Dec 2017.  Estimates she gets 6-7 hrs of sleep per night.  Uses a CPAP for sleep apnea.  Sometimes gets very sleepy starting in the morning, but usually in the afternoon, and almost always feels exhausted by 10 PM, but is still studying at that time.    Blood sugars have been creeping up; 104 this past annual checkup, and 107 in 2015.    24-hr recall (Sunday): (Up at 7 AM) B (8:30 AM)-   1 biscuit, 3 slc bacon, 2 c coffee, 4 T flavored creamer & 1-2  t sugar Snk ( PM)-   --- L ( PM)-  --- Snk (3 PM)-  1 oz potato chips, small slc cake, water D (6:30 PM)-  1/2  cornbrd drsng, 1/2 c mac&chs, chx leg with skin, water  Snk (11 PM)-  1 handful tortilla chips, 1 small slc lemon cake Typical day? No.  This was a Sunday, [place].  Also had a family dinner for a pre-Th'giving meal.    Progress Towards Goal(s):  In progress.   Nutritional Diagnosis:  NI-5.8.3 Inappropriate intake of types of carbohydrates (specify):   As related to snacks and desserts mostly (beverages some).  As evidenced by usual daily intake of sugary food and/or drinks.    Intervention:  Nutrition education.  Handouts given during visit include:  AVS  Demonstrated degree of understanding via:  Teach Back   Monitoring/Evaluation:  Dietary intake, exercise, and body weight in 2 week(s).

## 2015-05-06 ENCOUNTER — Ambulatory Visit: Payer: BC Managed Care – PPO | Admitting: Family Medicine

## 2015-10-09 ENCOUNTER — Other Ambulatory Visit: Payer: Self-pay

## 2015-10-09 DIAGNOSIS — Z1231 Encounter for screening mammogram for malignant neoplasm of breast: Secondary | ICD-10-CM

## 2015-10-13 ENCOUNTER — Ambulatory Visit
Admission: RE | Admit: 2015-10-13 | Discharge: 2015-10-13 | Disposition: A | Discharge status: Home / Self Care | Payer: BC Managed Care – PPO | Source: Ambulatory Visit

## 2015-10-13 DIAGNOSIS — Z1231 Encounter for screening mammogram for malignant neoplasm of breast: Secondary | ICD-10-CM

## 2017-06-03 ENCOUNTER — Other Ambulatory Visit: Payer: Self-pay | Admitting: Family Medicine

## 2017-06-03 DIAGNOSIS — Z1231 Encounter for screening mammogram for malignant neoplasm of breast: Secondary | ICD-10-CM

## 2017-07-04 ENCOUNTER — Ambulatory Visit
Admission: RE | Admit: 2017-07-04 | Discharge: 2017-07-04 | Disposition: A | Discharge status: Home / Self Care | Payer: BC Managed Care – PPO | Source: Ambulatory Visit | Attending: Family Medicine | Admitting: Family Medicine

## 2017-07-04 DIAGNOSIS — Z1231 Encounter for screening mammogram for malignant neoplasm of breast: Secondary | ICD-10-CM

## 2017-10-11 ENCOUNTER — Other Ambulatory Visit: Payer: Self-pay | Admitting: Obstetrics and Gynecology

## 2018-01-02 ENCOUNTER — Telehealth: Payer: Self-pay | Admitting: Podiatry

## 2018-01-02 NOTE — Telephone Encounter (Signed)
Wanting Medical Record Release form sent to her email. [email or web address]

## 2018-01-11 ENCOUNTER — Ambulatory Visit: Payer: BC Managed Care – PPO | Admitting: Podiatry

## 2018-02-06 ENCOUNTER — Encounter: Payer: Self-pay | Admitting: Podiatry

## 2018-02-06 ENCOUNTER — Ambulatory Visit: Payer: BC Managed Care – PPO

## 2018-02-06 ENCOUNTER — Ambulatory Visit: Payer: BC Managed Care – PPO | Admitting: Podiatry

## 2018-02-06 VITALS — BP 104/67 | HR 64 | Resp 16

## 2018-02-06 DIAGNOSIS — M722 Plantar fascial fibromatosis: Secondary | ICD-10-CM | POA: Diagnosis not present

## 2018-02-06 NOTE — Patient Instructions (Signed)
For instructions on how to put on your Plantar Fascial Brace, please visit [email or web address]   Plantar Fasciitis (Heel Spur Syndrome) with Rehab The plantar fascia is a fibrous, ligament-like, soft-tissue structure that spans the bottom of the foot. Plantar fasciitis is a condition that causes pain in the foot due to inflammation of the tissue. SYMPTOMS   Pain and tenderness on the underneath side of the foot.  Pain that worsens with standing or walking. CAUSES  Plantar fasciitis is caused by irritation and injury to the plantar fascia on the underneath side of the foot. Common mechanisms of injury include:  Direct trauma to bottom of the foot.  Damage to a small nerve that runs under the foot where the main fascia attaches to the heel bone.  Stress placed on the plantar fascia due to bone spurs. RISK INCREASES WITH:   Activities that place stress on the plantar fascia (running, jumping, pivoting, or cutting).  Poor strength and flexibility.  Improperly fitted shoes.  Tight calf muscles.  Flat feet.  Failure to warm-up properly before activity.  Obesity. PREVENTION  Warm up and stretch properly before activity.  Allow for adequate recovery between workouts.  Maintain physical fitness:  Strength, flexibility, and endurance.  Cardiovascular fitness.  Maintain a health body weight.  Avoid stress on the plantar fascia.  Wear properly fitted shoes, including arch supports for individuals who have flat feet.  PROGNOSIS  If treated properly, then the symptoms of plantar fasciitis usually resolve without surgery. However, occasionally surgery is necessary.  RELATED COMPLICATIONS   Recurrent symptoms that may result in a chronic condition.  Problems of the lower back that are caused by compensating for the injury, such as limping.  Pain or weakness of the foot during push-off following surgery.  Chronic inflammation, scarring, and partial or complete  fascia tear, occurring more often from repeated injections.  TREATMENT  Treatment initially involves the use of ice and medication to help reduce pain and inflammation. The use of strengthening and stretching exercises may help reduce pain with activity, especially stretches of the Achilles tendon. These exercises may be performed at home or with a therapist. Your caregiver may recommend that you use heel cups of arch supports to help reduce stress on the plantar fascia. Occasionally, corticosteroid injections are given to reduce inflammation. If symptoms persist for greater than 6 months despite non-surgical (conservative), then surgery may be recommended.   MEDICATION   If pain medication is necessary, then nonsteroidal anti-inflammatory medications, such as aspirin and ibuprofen, or other minor pain relievers, such as acetaminophen, are often recommended.  Do not take pain medication within 7 days before surgery.  Prescription pain relievers may be given if deemed necessary by your caregiver. Use only as directed and only as much as you need.  Corticosteroid injections may be given by your caregiver. These injections should be reserved for the most serious cases, because they may only be given a certain number of times.  HEAT AND COLD  Cold treatment (icing) relieves pain and reduces inflammation. Cold treatment should be applied for 10 to 15 minutes every 2 to 3 hours for inflammation and pain and immediately after any activity that aggravates your symptoms. Use ice packs or massage the area with a piece of ice (ice massage).  Heat treatment may be used prior to performing the stretching and strengthening activities prescribed by your caregiver, physical therapist, or athletic trainer. Use a heat pack or soak the injury in warm water.  SEEK IMMEDIATE MEDICAL   CARE IF:  Treatment seems to offer no benefit, or the condition worsens.  Any medications produce adverse side effects.   EXERCISES- RANGE OF MOTION (ROM) AND STRETCHING EXERCISES - Plantar Fasciitis (Heel Spur Syndrome) These exercises may help you when beginning to rehabilitate your injury. Your symptoms may resolve with or without further involvement from your physician, physical therapist or athletic trainer. While completing these exercises, remember:   Restoring tissue flexibility helps normal motion to return to the joints. This allows healthier, less painful movement and activity.  An effective stretch should be held for at least 30 seconds.  A stretch should never be painful. You should only feel a gentle lengthening or release in the stretched tissue.  RANGE OF MOTION - Toe Extension, Flexion  Sit with your right / left leg crossed over your opposite knee.  Grasp your toes and gently pull them back toward the top of your foot. You should feel a stretch on the bottom of your toes and/or foot.  Hold this stretch for 10 seconds.  Now, gently pull your toes toward the bottom of your foot. You should feel a stretch on the top of your toes and or foot.  Hold this stretch for 10 seconds. Repeat  times. Complete this stretch 3 times per day.   RANGE OF MOTION - Ankle Dorsiflexion, Active Assisted  Remove shoes and sit on a chair that is preferably not on a carpeted surface.  Place right / left foot under knee. Extend your opposite leg for support.  Keeping your heel down, slide your right / left foot back toward the chair until you feel a stretch at your ankle or calf. If you do not feel a stretch, slide your bottom forward to the edge of the chair, while still keeping your heel down.  Hold this stretch for 10 seconds. Repeat 3 times. Complete this stretch 2 times per day.   STRETCH  Gastroc, Standing  Place hands on wall.  Extend right / left leg, keeping the front knee somewhat bent.  Slightly point your toes inward on your back foot.  Keeping your right / left heel on the floor and your  knee straight, shift your weight toward the wall, not allowing your back to arch.  You should feel a gentle stretch in the right / left calf. Hold this position for 10 seconds. Repeat 3 times. Complete this stretch 2 times per day.  STRETCH  Soleus, Standing  Place hands on wall.  Extend right / left leg, keeping the other knee somewhat bent.  Slightly point your toes inward on your back foot.  Keep your right / left heel on the floor, bend your back knee, and slightly shift your weight over the back leg so that you feel a gentle stretch deep in your back calf.  Hold this position for 10 seconds. Repeat 3 times. Complete this stretch 2 times per day.  STRETCH  Gastrocsoleus, Standing  Note: This exercise can place a lot of stress on your foot and ankle. Please complete this exercise only if specifically instructed by your caregiver.   Place the ball of your right / left foot on a step, keeping your other foot firmly on the same step.  Hold on to the wall or a rail for balance.  Slowly lift your other foot, allowing your body weight to press your heel down over the edge of the step.  You should feel a stretch in your right / left calf.  Hold this   position for 10 seconds.  Repeat this exercise with a slight bend in your right / left knee. Repeat 3 times. Complete this stretch 2 times per day.   STRENGTHENING EXERCISES - Plantar Fasciitis (Heel Spur Syndrome)  These exercises may help you when beginning to rehabilitate your injury. They may resolve your symptoms with or without further involvement from your physician, physical therapist or athletic trainer. While completing these exercises, remember:   Muscles can gain both the endurance and the strength needed for everyday activities through controlled exercises.  Complete these exercises as instructed by your physician, physical therapist or athletic trainer. Progress the resistance and repetitions only as guided.  STRENGTH -  Towel Curls  Sit in a chair positioned on a non-carpeted surface.  Place your foot on a towel, keeping your heel on the floor.  Pull the towel toward your heel by only curling your toes. Keep your heel on the floor. Repeat 3 times. Complete this exercise 2 times per day.  STRENGTH - Ankle Inversion  Secure one end of a rubber exercise band/tubing to a fixed object (table, pole). Loop the other end around your foot just before your toes.  Place your fists between your knees. This will focus your strengthening at your ankle.  Slowly, pull your big toe up and in, making sure the band/tubing is positioned to resist the entire motion.  Hold this position for 10 seconds.  Have your muscles resist the band/tubing as it slowly pulls your foot back to the starting position. Repeat 3 times. Complete this exercises 2 times per day.  Document Released: 05/17/2005 Document Revised: 08/09/2011 Document Reviewed: 08/29/2008 Exit[employer] Patient Information 2014 [employer]. 

## 2018-02-07 NOTE — Progress Notes (Signed)
   Subjective: 57 year old female presenting today as a new patient with a chief complaint of aching pain of the plantar left heel that began about one year ago. She states the pain is worse in the morning when she first gets out of bed. She was told by Dr. Elijah Birk that she has a bone spur. Walking increases her pain. She has been treated with injections, a night brace and insoles. Patient is here for further evaluation and treatment.   No past medical history on file.   Objective: Physical Exam General: The patient is alert and oriented x3 in no acute distress.  Dermatology: Skin is warm, dry and supple bilateral lower extremities. Negative for open lesions or macerations bilateral.   Vascular: Dorsalis Pedis and Posterior Tibial pulses palpable bilateral.  Capillary fill time is immediate to all digits.  Neurological: Epicritic and protective threshold intact bilateral.   Musculoskeletal: Tenderness to palpation to the plantar aspect of the left heel along the plantar fascia. All other joints range of motion within normal limits bilateral. Strength 5/5 in all groups bilateral.   Assessment: 1. Plantar fasciitis left foot  Plan of Care:  1. Patient evaluated.   2. Discussed surgical versus EPAT treatment modalities.  3. Patient opts for EPAT.  4. Appointment with Shanda Bumps for EPAT.  5. Continue wearing night splint and custom molded orthotics from Dr. Elijah Birk.  6. Return to clinic as needed.     Felecia Shelling, DPM [facility]  Dr. Felecia Shelling, DPM    [address] [place], [place] [address]                Office [phone]  Fax [phone]

## 2018-03-08 ENCOUNTER — Other Ambulatory Visit: Payer: BC Managed Care – PPO

## 2018-06-14 ENCOUNTER — Ambulatory Visit: Payer: Self-pay

## 2018-06-14 DIAGNOSIS — M79676 Pain in unspecified toe(s): Secondary | ICD-10-CM

## 2018-06-14 DIAGNOSIS — M722 Plantar fascial fibromatosis: Secondary | ICD-10-CM

## 2018-06-20 NOTE — Progress Notes (Signed)
Patient is here today with complaint of aching pain in her plantar heel on her left foot.  She states the pain is been present for a year, she has tried injections, oral medications, NSAIDs, ice, and night brace.  She states that none of these therapies have helped her so far.  Pain on palpation to left plantar heel, with patient complaint of right heel hurting as well.  Right heel was not evaluated at this visit.  Left foot was treated with ESWT at 5 J.  Massage tool was used and tolerated well.  We discussed incorporating both feet at the next visit depending on her reaction to current treatment of her left foot.  Advised her to avoid NSAIDs and ice, and utilize her boot or supportive shoe.  She is to follow-up in 1 week for her second treatment.

## 2018-06-21 ENCOUNTER — Ambulatory Visit (INDEPENDENT_AMBULATORY_CARE_PROVIDER_SITE_OTHER): Payer: BC Managed Care – PPO

## 2018-06-21 DIAGNOSIS — M722 Plantar fascial fibromatosis: Secondary | ICD-10-CM

## 2018-06-21 DIAGNOSIS — M79676 Pain in unspecified toe(s): Secondary | ICD-10-CM

## 2018-06-26 NOTE — Progress Notes (Signed)
Patient is here today with complaint of aching pain in her plantar heel on her left foot.  She states the pain is been present for a year, she has tried injections, oral medications, NSAIDs, ice, and night brace.  She states that none of these therapies have helped her so far.  She states that she did have a couple days where her pain increased, but then the pain went back to her baseline.  Pain on palpation to left plantar heel, with patient complaint of right heel hurting as well.  Right heel was not evaluated at this visit.  Left foot was treated with ESWT at 7 J bilateral massage tool was used and tolerated well.  We discussed incorporating both feet at the next visit depending on her reaction to current treatment of her left foot.  Advised her to avoid NSAIDs and ice, and utilize her boot or supportive shoe.  She is to follow-up in 1 week for her third treatment.

## 2018-06-28 ENCOUNTER — Ambulatory Visit (INDEPENDENT_AMBULATORY_CARE_PROVIDER_SITE_OTHER): Payer: BC Managed Care – PPO

## 2018-06-28 DIAGNOSIS — M79676 Pain in unspecified toe(s): Secondary | ICD-10-CM

## 2018-06-28 DIAGNOSIS — M722 Plantar fascial fibromatosis: Secondary | ICD-10-CM

## 2018-07-04 NOTE — Progress Notes (Signed)
Patient is here today with complaint of aching pain in her plantar heel on her left foot.  She states the pain is been present for a year, she has tried injections, oral medications, NSAIDs, ice, and night brace.  She states that none of these therapies have helped her so far.  She states that she did have a couple days where her pain increased, but then the pain went back to her baseline.  She states that her left foot the pain is improving and is not as intense, right foot there is been no changes so far.  Pain on palpation to left plantar heel, with patient complaint of right heel hurting as well.  Right heel was not evaluated at this visit.  Left foot was treated with ESWT at 7 J bilateral massage tool was used and tolerated well.  Left foot 3 treatment so far, right foot 2 treatments.  Advised her to avoid NSAIDs and ice, and utilize her boot or supportive shoe.  She is to follow-up in 2 weeks for 4th treatment

## 2018-07-05 ENCOUNTER — Ambulatory Visit: Payer: Self-pay

## 2018-07-05 DIAGNOSIS — M79676 Pain in unspecified toe(s): Secondary | ICD-10-CM

## 2018-07-05 DIAGNOSIS — M722 Plantar fascial fibromatosis: Secondary | ICD-10-CM

## 2018-07-10 NOTE — Progress Notes (Signed)
Patient is here today with complaint of aching pain in her plantar heel on her left foot.  She states the pain is been present for a year, she has tried injections, oral medications, NSAIDs, ice, and night brace.  She states that none of these therapies have helped her so far.  She states that she did have a couple days where her pain increased, but then the pain went back to her baseline.  She states that her left foot the pain is improving and is not as intense, right foot there is been no changes so far. She does state that her morning pain has improved.  Pain on palpation to left plantar heel, with patient complaint of right heel hurting as well.  Right heel was not evaluated at this visit.  Left foot was treated with ESWT at 4.5 J bilateral massage tool was used and tolerated well.  Left foot 3 treatment so far, right foot 2 treatments.  Advised her to avoid NSAIDs and ice, and utilize her boot or supportive shoe.  She is to follow-up in 2 weeks for 5th treatment

## 2018-07-18 ENCOUNTER — Ambulatory Visit: Payer: BC Managed Care – PPO

## 2018-07-18 DIAGNOSIS — M722 Plantar fascial fibromatosis: Secondary | ICD-10-CM

## 2018-07-19 NOTE — Progress Notes (Signed)
Patient is here today with complaint of aching pain in her plantar heel on her left foot.  She states the pain is been present for a year, she has tried injections, oral medications, NSAIDs, ice, and night brace.  She states that none of these therapies have helped her so far.  She states that she did have a couple days where her pain increased, but then the pain went back to her baseline.  She states that her left foot the pain is improving and is not as intense, right foot there is been no changes so far. She does state that her morning pain has improved.  Pain on palpation to left plantar heel, with patient complaint of right heel hurting as well.  Right heel was not evaluated at this visit.  Left foot was treated with ESWT at 4.5 J bilateral massage tool was used and tolerated well.  Left foot 4th treatment so far, right foot 3 treatments.  Advised her to avoid NSAIDs and ice, and utilize her boot or supportive shoe.  She is to follow-up in 2 weeks for 5th treatment

## 2018-08-02 ENCOUNTER — Ambulatory Visit: Payer: BC Managed Care – PPO

## 2018-08-02 DIAGNOSIS — M722 Plantar fascial fibromatosis: Secondary | ICD-10-CM

## 2018-08-02 NOTE — Progress Notes (Signed)
Patient is here today with complaint of aching pain in her plantar heel on her left foot.  She states the pain is been present for a year, she has tried injections, oral medications, NSAIDs, ice, and night brace.  She states that none of these therapies have helped her so far.  She states that she did have a couple days where her pain increased, but then the pain went back to her baseline.  She states that her pain is mostly on th ebottom of her heels, and that overall it is improving.   Pain on palpation to bilateral heels   Bilateral feet were treated with ESWT and the procedure was tolerated well. She is to follow up in 2 weeks for 6th treatment

## 2018-08-22 ENCOUNTER — Other Ambulatory Visit: Payer: BC Managed Care – PPO

## 2018-09-01 ENCOUNTER — Other Ambulatory Visit: Payer: Self-pay

## 2018-09-01 ENCOUNTER — Ambulatory Visit: Payer: BC Managed Care – PPO

## 2018-09-01 VITALS — Temp 98.0°F

## 2018-09-01 DIAGNOSIS — M722 Plantar fascial fibromatosis: Secondary | ICD-10-CM

## 2018-10-02 ENCOUNTER — Other Ambulatory Visit: Payer: BC Managed Care – PPO

## 2018-10-04 ENCOUNTER — Ambulatory Visit: Payer: BC Managed Care – PPO | Admitting: Podiatry

## 2018-10-04 NOTE — Progress Notes (Signed)
Patient is here today with complaint of aching pain in her plantar heel on her left foot.  She states the pain is been present for a year, she has tried injections, oral medications, NSAIDs, ice, and night brace.  She states that none of these therapies have helped her so far.  She states that she did have a couple days where her pain increased, but then the pain went back to her baseline.  She states that her pain is mostly on th ebottom of her heels, and that overall it is improving.   Pain on palpation to bilateral heels   Bilateral feet were treated with ESWT and the procedure was tolerated well. She is to follow up with Dr Logan Bores in 4-6 weeks if not imrpved

## 2018-10-05 ENCOUNTER — Ambulatory Visit: Payer: BC Managed Care – PPO | Admitting: Dietician

## 2019-01-01 ENCOUNTER — Ambulatory Visit: Payer: BC Managed Care – PPO | Admitting: Podiatry

## 2019-01-11 ENCOUNTER — Other Ambulatory Visit: Payer: Self-pay

## 2019-01-11 ENCOUNTER — Encounter: Payer: Self-pay | Admitting: Dietician

## 2019-01-11 ENCOUNTER — Encounter: Payer: BC Managed Care – PPO | Attending: Family Medicine | Admitting: Dietician

## 2019-01-11 NOTE — Progress Notes (Signed)
Diabetes Self-Management Education  Visit Type: First/Initial  Appt. Start Time: 1545 Appt. End Time: 1700  01/16/2019  Ms. [name], identified by name and date of birth, is a 58 y.o. female with a diagnosis of Diabetes: Type 2.   ASSESSMENT History includes type 2 diabetes, HTN, OSA -C-pap, Bariatric surgery, GERD, plantar fascitis.  States that she had prediabetes for years and officially diagnosed with diabetes 04/2018.   Medications include:  Metformin, Vitamin B-12, Vitamin D A1C 6.6% 12/16/17  Previous education: [facility] [place] 2007/08 Saw Ju[facility] Slovak, [place] 04/21/15 Lost 70 lbs after the bariatric surgery in 2007 but regained it.  Lost 14 lbs when started Metformin and has maintained this.  Patient lives with her husband.  Her son sometimes lives there.  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  She does most of the shopping and cooking.  Having problems with Plantar Fascitis and is unable to exercise so bought a stationary bike and hates it and has no motivation to use this.  Tries to avoid Splenda., juice. Sleeps 6 hours  Height 5\' 1"  (1.549 m), weight 213 lb (96.6 kg). Body mass index is 40.25 kg/m.  Diabetes Self-Management Education - 01/11/19 1603      Visit Information   Visit Type  First/Initial      Initial Visit   Diabetes Type  Type 2    Are you currently following a meal plan?  No    Are you taking your medications as prescribed?  Yes    Date Diagnosed  2019      Health Coping   How would you rate your overall health?  Good      Psychosocial Assessment   Patient Belief/Attitude about Diabetes  Motivated to manage diabetes    Self-care barriers  None    Self-management support  Doctor's office;Family    Other persons present  Patient    Patient Concerns  Nutrition/Meal planning;Glycemic Control;Weight Control;Healthy Lifestyle    Special Needs  None    Preferred Learning Style  No preference indicated     Learning Readiness  Ready    How often do you need to have someone help you when you read instructions, pamphlets, or other written materials from your doctor or pharmacy?  1 - Never    What is the last grade level you completed in school?  4 years college      Pre-Education Assessment   Patient understands the diabetes disease and treatment process.  Needs Instruction    Patient understands incorporating nutritional management into lifestyle.  Needs Instruction    Patient undertands incorporating physical activity into lifestyle.  Needs Instruction    Patient understands using medications safely.  Needs Instruction    Patient understands monitoring blood glucose, interpreting and using results  Needs Instruction    Patient understands prevention, detection, and treatment of acute complications.  Needs Instruction    Patient understands prevention, detection, and treatment of chronic complications.  Needs Instruction    Patient understands how to develop strategies to address psychosocial issues.  Needs Instruction    Patient understands how to develop strategies to promote health/change behavior.  Needs Instruction      Complications   Last HgB A1C per patient/outside source  6.4 %   07/2018   How often do you check your blood sugar?  1-2 times/day    Fasting Blood glucose range (mg/dL)  70-129    Number of hypoglycemic episodes per month  0    Number of hyperglycemic episodes per week  0    Have you had a dilated eye exam in the past 12 months?  Yes    Have you had a dental exam in the past 12 months?  Yes    Are you checking your feet?  Yes    How many days per week are you checking your feet?  7      Dietary Intake   Breakfast  eggs on [employer] bread with cheese, watermelon, blueberries OR  Grits with cheese and/or bacon   11:00   Snack (afternoon)  fruit or potato chips or PB NABS or cookies or sandwich with lunch meat    Dinner  Chicken, brocoli mixed vegetables with cheese, brown  rice OR spaghetti with ground [place]urkey OR salad with protein without dressing    Snack (evening)  PB nabs or PB sandwich    Beverage(s)  water, Coffee with sweetened [place] cream,  rare regular soda.      Exercise   Exercise Type  ADL's      Patient Education   Previous Diabetes Education  No    Disease state   Definition of diabetes, type 1 and 2, and the diagnosis of diabetes    Nutrition management   Role of diet in the treatment of diabetes and the relationship between the three main macronutrients and blood glucose level;Food label reading, portion sizes and measuring food.;Meal timing in regards to the patients' current diabetes medication.;Meal options for control of blood glucose level and chronic complications.    Physical activity and exercise   Role of exercise on diabetes management, blood pressure control and cardiac health.;Helped patient identify appropriate exercises in relation to his/her diabetes, diabetes complications and other health issue.    Medications  Reviewed patients medication for diabetes, action, purpose, timing of dose and side effects.    Monitoring  Purpose and frequency of SMBG.;Identified appropriate SMBG and/or A1C goals.;Daily foot exams;Yearly dilated eye exam    Acute complications  Taught treatment of hypoglycemia - the 15 rule.    Chronic complications  Relationship between chronic complications and blood glucose control;Dental care    Psychosocial adjustment  Worked with patient to identify barriers to care and solutions;Identified and addressed patients feelings and concerns about diabetes    Personal strategies to promote health  Lifestyle issues that need to be addressed for better diabetes care      Individualized Goals (developed by patient)   Nutrition  General guidelines for healthy choices and portions discussed    Physical Activity  Exercise 3-5 times per week;30 minutes per day    Medications  take my medication as prescribed    Monitoring    test my blood glucose as discussed      Post-Education Assessment   Patient understands the diabetes disease and treatment process.  Demonstrates understanding / competency    Patient understands incorporating nutritional management into lifestyle.  Demonstrates understanding / competency    Patient undertands incorporating physical activity into lifestyle.  Demonstrates understanding / competency    Patient understands using medications safely.  Demonstrates understanding / competency    Patient understands monitoring blood glucose, interpreting and using results  Demonstrates understanding / competency    Patient understands prevention, detection, and treatment of acute complications.  Demonstrates understanding / competency    Patient understands prevention, detection, and treatment of chronic complications.  Demonstrates understanding / competency    Patient understands how to develop strategies to address  psychosocial issues.  Demonstrates understanding / competency    Patient understands how to develop strategies to promote health/change behavior.  Demonstrates understanding / competency      Outcomes   Expected Outcomes  Demonstrated interest in learning. Expect positive outcomes    Future DMSE  PRN    Program Status  Completed       Individualized Plan for Diabetes Self-Management Training:   Learning Objective:  Patient will have a greater understanding of diabetes self-management. Patient education plan is to attend individual and/or group sessions per assessed needs and concerns.   Plan:   Patient Instructions  Eat 3 meals per day to help avoid eating between meals and at night. If you are hungry between meals, have a small amount of carbohydrate with a protein, a protein alone, or raw vegetables. Try to get more sleep.  Aim for 7-8 hours per night. Find ways to be more active.  You tube armchair exercise, dancing, stationary bide.  Aim for 30 minutes most days.  Aim for  3-4 Carb Choices per meal (30-45 grams) +/- 1 either way  Aim for 0-1 Carbs per snack if hungry  Include protein in moderation with your meals and snacks Consider reading food labels for Total Carbohydrate of foods Continue checking BG at alternate times per day  Continue taking medication as directed by MD      Expected Outcomes:  Demonstrated interest in learning. Expect positive outcomes  Education material provided: ADA - How to Thrive: A Guide for Your Journey with Diabetes, Meal plan card and Snack sheet  If problems or questions, patient to contact team via:  Phone and Email  Future DSME appointment: PRN

## 2019-01-11 NOTE — Patient Instructions (Signed)
Eat 3 meals per day to help avoid eating between meals and at night. If you are hungry between meals, have a small amount of carbohydrate with a protein, a protein alone, or raw vegetables. Try to get more sleep.  Aim for 7-8 hours per night. Find ways to be more active.  You tube armchair exercise, dancing, stationary bide.  Aim for 30 minutes most days.  Aim for 3-4 Carb Choices per meal (30-45 grams) +/- 1 either way  Aim for 0-1 Carbs per snack if hungry  Include protein in moderation with your meals and snacks Consider reading food labels for Total Carbohydrate of foods Continue checking BG at alternate times per day  Continue taking medication as directed by MD

## 2019-01-15 ENCOUNTER — Ambulatory Visit: Payer: BC Managed Care – PPO | Admitting: Podiatry

## 2019-03-01 ENCOUNTER — Other Ambulatory Visit: Payer: Self-pay | Admitting: Emergency Medicine

## 2019-03-01 DIAGNOSIS — Z20822 Contact with and (suspected) exposure to covid-19: Secondary | ICD-10-CM

## 2019-03-02 LAB — NOVEL CORONAVIRUS, NAA: SARS-CoV-2, NAA: NOT DETECTED

## 2019-07-10 ENCOUNTER — Other Ambulatory Visit: Payer: Self-pay | Admitting: Family Medicine

## 2019-07-10 DIAGNOSIS — Z1231 Encounter for screening mammogram for malignant neoplasm of breast: Secondary | ICD-10-CM

## 2019-08-07 ENCOUNTER — Other Ambulatory Visit: Payer: Self-pay

## 2019-08-07 ENCOUNTER — Ambulatory Visit
Admission: RE | Admit: 2019-08-07 | Discharge: 2019-08-07 | Disposition: A | Discharge status: Home / Self Care | Payer: BC Managed Care – PPO | Source: Ambulatory Visit | Attending: Family Medicine | Admitting: Family Medicine

## 2019-08-07 DIAGNOSIS — Z1231 Encounter for screening mammogram for malignant neoplasm of breast: Secondary | ICD-10-CM

## 2019-08-16 ENCOUNTER — Ambulatory Visit: Payer: BC Managed Care – PPO | Attending: Family

## 2019-08-16 DIAGNOSIS — Z23 Encounter for immunization: Secondary | ICD-10-CM

## 2019-08-16 NOTE — Progress Notes (Signed)
   Covid-19 Vaccination Clinic  Name:  [name]    MRN: [id number] DOB: 1961/05/30  08/16/2019  [name] was observed post Covid-19 immunization for 15 minutes without incident. She was provided with Vaccine Information Sheet and instruction to access the V-Safe system.   [name] was instructed to call 911 with any severe reactions post vaccine: Marland Kitchen Difficulty breathing  . Swelling of face and throat  . A fast heartbeat  . A bad rash all over body  . Dizziness and weakness   Immunizations Administered    Name Date Dose VIS Date Route   Moderna COVID-19 Vaccine 08/16/2019 10:48 AM 0.5 mL 05/01/2019 Intramuscular   Manufacturer: Moderna   Lot: [id number]   NDC: 56256-389-37

## 2019-09-18 ENCOUNTER — Ambulatory Visit: Payer: BC Managed Care – PPO | Attending: Family

## 2019-09-18 DIAGNOSIS — Z23 Encounter for immunization: Secondary | ICD-10-CM

## 2019-09-18 NOTE — Progress Notes (Signed)
   Covid-19 Vaccination Clinic  Name:  [name]    MRN: [id number] DOB: February 07, 1961  09/18/2019  [name] was observed post Covid-19 immunization for 30 minutes based on pre-vaccination screening without incident. She was provided with Vaccine Information Sheet and instruction to access the V-Safe system.   [name] was instructed to call 911 with any severe reactions post vaccine: Marland Kitchen Difficulty breathing  . Swelling of face and throat  . A fast heartbeat  . A bad rash all over body  . Dizziness and weakness   Immunizations Administered    Name Date Dose VIS Date Route   Moderna COVID-19 Vaccine 09/18/2019 10:39 AM 0.5 mL 05/2019 Intramuscular   Manufacturer: Moderna   Lot: [id number]   NDC: 65800-634-94

## 2020-04-08 ENCOUNTER — Ambulatory Visit: Payer: BC Managed Care – PPO | Attending: Internal Medicine

## 2020-04-08 DIAGNOSIS — Z23 Encounter for immunization: Secondary | ICD-10-CM

## 2020-04-08 NOTE — Progress Notes (Signed)
   Covid-19 Vaccination Clinic  Name:  [name]    MRN: [id number] DOB: 08/19/1960  04/08/2020  [name] was observed post Covid-19 immunization for 30 minutes based on pre-vaccination screening without incident. She was provided with Vaccine Information Sheet and instruction to access the V-Safe system.   [name] was instructed to call 911 with any severe reactions post vaccine: Marland Kitchen Difficulty breathing  . Swelling of face and throat  . A fast heartbeat  . A bad rash all over body  . Dizziness and weakness

## 2020-09-17 ENCOUNTER — Other Ambulatory Visit: Payer: Self-pay | Admitting: Family Medicine

## 2020-09-17 DIAGNOSIS — Z1231 Encounter for screening mammogram for malignant neoplasm of breast: Secondary | ICD-10-CM

## 2020-11-06 ENCOUNTER — Ambulatory Visit
Admission: RE | Admit: 2020-11-06 | Discharge: 2020-11-06 | Disposition: A | Discharge status: Home / Self Care | Payer: BC Managed Care – PPO | Source: Ambulatory Visit | Attending: Family Medicine | Admitting: Family Medicine

## 2020-11-06 ENCOUNTER — Other Ambulatory Visit: Payer: Self-pay

## 2020-11-06 DIAGNOSIS — Z1231 Encounter for screening mammogram for malignant neoplasm of breast: Secondary | ICD-10-CM

## 2021-04-06 ENCOUNTER — Ambulatory Visit: Payer: BC Managed Care – PPO | Attending: Family

## 2021-04-06 DIAGNOSIS — Z23 Encounter for immunization: Secondary | ICD-10-CM

## 2021-04-13 NOTE — Progress Notes (Signed)
   Covid-19 Vaccination Clinic  Name:  [name]    MRN: [id number] DOB: 05/08/1961  04/13/2021  [name] was observed post Covid-19 immunization for 15 minutes without incident. She was provided with Vaccine Information Sheet and instruction to access the V-Safe system.   [name] was instructed to call 911 with any severe reactions post vaccine: Difficulty breathing  Swelling of face and throat  A fast heartbeat  A bad rash all over body  Dizziness and weakness

## 2021-05-14 ENCOUNTER — Other Ambulatory Visit: Payer: Self-pay | Admitting: Family Medicine

## 2021-06-06 ENCOUNTER — Other Ambulatory Visit: Payer: Self-pay | Admitting: Family Medicine

## 2021-06-06 DIAGNOSIS — H93A1 Pulsatile tinnitus, right ear: Secondary | ICD-10-CM

## 2021-06-11 ENCOUNTER — Other Ambulatory Visit: Payer: Self-pay

## 2021-06-11 ENCOUNTER — Ambulatory Visit (INDEPENDENT_AMBULATORY_CARE_PROVIDER_SITE_OTHER): Payer: BC Managed Care – PPO | Admitting: Allergy & Immunology

## 2021-06-11 ENCOUNTER — Encounter: Payer: Self-pay | Admitting: Allergy & Immunology

## 2021-06-11 VITALS — BP 118/72 | HR 89 | Temp 95.5°F | Resp 16 | Ht 61.0 in | Wt 208.0 lb

## 2021-06-11 DIAGNOSIS — L508 Other urticaria: Secondary | ICD-10-CM

## 2021-06-11 DIAGNOSIS — J3089 Other allergic rhinitis: Secondary | ICD-10-CM | POA: Diagnosis not present

## 2021-06-11 NOTE — Patient Instructions (Addendum)
1. Acute urticaria - Sometimes we never figure out what is causing hives. - Testing to the most common foods was completely negative. - There is a the low positive predictive value of food allergy testing and hence the high possibility of false positives. - In contrast, food allergy testing has a high negative predictive value, therefore if testing is negative we can be relatively assured that they are indeed negative.  - This might have been related to mold exposure (at least with the hotel), although often times we have no idea at all.   2. Perennial allergic rhinitis - Testing today showed: indoor molds and dog. - Copy of test results provided.  - Avoidance measures provided. - Continue with: over the counter antihistamines and Flonase as needed.  - I do not think that we need anything like allergy shots at this point in time. - Your symptoms do not seem overly severe.   3. Return in about 3 months (around 09/09/2021).    Please inform us of any Emergency Department visits, hospitalizations, or changes in symptoms. Call us before going to the ED for breathing or allergy symptoms since we might be able to fit you in for a sick visit. Feel free to contact us anytime with any questions, problems, or concerns.  It was a pleasure to meet you today!  Websites that have reliable patient information: 1. American Academy of Asthma, Allergy, and Immunology: www.aaaai.org 2. Food Allergy Research and Education (FARE): foodallergy.org 3. Mothers of Asthmatics: http://www.asthmacommunitynetwork.org 4. American College of Allergy, Asthma, and Immunology: www.acaai.org   COVID-19 Vaccine Information can be found at: [email or web address] For questions related to vaccine distribution or appointments, please email [email or web address] or call [phone].   We realize that you might be concerned about having an allergic reaction to the  COVID19 vaccines. To help with that concern, WE ARE OFFERING THE COVID19 VACCINES IN OUR OFFICE! Ask the front desk for dates!     Like [place] on [employer] and Instagram for our latest updates!      A healthy democracy works best when [employer] participate! Make sure you are registered to vote! If you have moved or changed any of your contact information, you will need to get this updated before voting!  In some cases, you MAY be able to register to vote online: [email or web address]     Airborne Adult Perc - 06/11/21 1552     Time Antigen Placed 1552    Allergen Manufacturer Waynette Buttery    Location Back    Number of Test 59    1. Control-Buffer 50% Glycerol Negative    2. Control-Histamine 1 mg/ml 2+    3. Albumin saline Negative    4. Bahia Negative    5. French Southern Territories Negative    6. Johnson Negative    7. Kentucky Blue Negative    8. Meadow Fescue Negative    9. Perennial Rye Negative    10. Sweet Vernal Negative    11. Timothy Negative    12. Cocklebur Negative    13. Burweed Marshelder Negative    14. Ragweed, short Negative    15. Ragweed, Giant Negative    16. Plantain,  English Negative    17. Lamb's Quarters Negative    18. Sheep Sorrell Negative    19. Rough Pigweed Negative    20. Marsh Elder, Rough Negative    21. Mugwort, Common Negative    22. Ash mix Negative    23. Birch mix Negative    24. Van Clines  American Negative    25. Box, Elder Negative    26. Cedar, red Negative    27. Cottonwood, Guinea-Biss[place]Eastern Negative    28. Elm mix Negative    29. Hickory Negative    30. Maple mix Negative    31. Oak, Guinea-Biss[place]astern mix Negative    32. Pecan Pollen Negative    33. Pine mix Negative    34. Sycamore Eastern Negative    35. Walnut, Black Pollen Negative    36. Alternaria alternata Negative    37. Cladosporium Herbarum Negative    38. Aspergillus mix Negative    39. Penicillium mix Negative    40. Bipolaris sorokiniana (Helminthosporium) Negative    41.  Drechslera spicifera (Curvularia) Negative    42. Mucor plumbeus Negative    43. Fusarium moniliforme Negative    44. Aureobasidium pullulans (pullulara) Negative    45. Rhizopus oryzae Negative    46. Botrytis cinera Negative    47. Epicoccum nigrum Negative    48. Phoma betae Negative    49. Candida Albicans Negative    50. Trichophyton mentagrophytes Negative    51. Mite, D Farinae  5,000 AU/ml Negative    52. Mite, D Pteronyssinus  5,000 AU/ml Negative    53. Cat Hair 10,000 BAU/ml Negative    54.  Dog Epithelia Negative    55. Mixed Feathers Negative    56. Horse Epithelia Negative    57. Cockroach, German Negative    58. Mouse Negative    59. Tobacco Leaf Negative             Food Perc - 06/11/21 1552       Test Information   Time Antigen Placed 1552    Allergen Manufacturer Waynette ButteryGreer    Location Back    Number of allergen test 10      Food   1. Peanut Negative    2. Soybean food Negative    3. Wheat, whole Negative    4. Sesame Negative    5. Milk, cow Negative    6. Egg White, chicken Negative    7. Casein Negative    8. Shellfish mix Negative    9. Fish mix Negative    10. Cashew Negative             Intradermal - 06/11/21 1554     Time Antigen Placed 1554    Allergen Manufacturer Waynette ButteryGreer    Location Arm    Number of Test 15    Control Negative    French Southern TerritoriesBermuda Negative    Johnson Negative    7 Grass Negative    Ragweed mix Negative    Weed mix Negative    Tree mix Negative    Mold 1 Negative    Mold 2 2+    Mold 3 Negative    Mold 4 Negative    Cat Negative    Dog 1+    Cockroach Negative    Mite mix Negative             Control of Mold Allergen   Mold and fungi can grow on a variety of surfaces provided certain temperature and moisture conditions exist.  Outdoor molds grow on plants, decaying vegetation and soil.  The major outdoor mold, Alternaria and Cladosporium, are found in very high numbers during hot and dry conditions.   Generally, a late Summer - Fall peak is seen for common outdoor fungal spores.  Rain will temporarily lower outdoor mold spore count, but  counts rise rapidly when the rainy period ends.  The most important indoor molds are Aspergillus and Penicillium.  Dark, humid and poorly ventilated basements are ideal sites for mold growth.  The next most common sites of mold growth are the bathroom and the kitchen.   Indoor (Perennial) Mold Control   Positive indoor molds via skin testing: Aspergillus and Penicillium  Maintain humidity below 50%. Clean washable surfaces with 5% bleach solution. Remove sources e.g. contaminated carpets.    Control of Dog or Cat Allergen  Avoidance is the best way to manage a dog or cat allergy. If you have a dog or cat and are allergic to dog or cats, consider removing the dog or cat from the home. If you have a dog or cat but dont want to find it a new home, or if your family wants a pet even though someone in the household is allergic, here are some strategies that may help keep symptoms at bay:  Keep the pet out of your bedroom and restrict it to only a few rooms. Be advised that keeping the dog or cat in only one room will not limit the allergens to that room. Dont pet, hug or kiss the dog or cat; if you do, wash your hands with soap and water. High-efficiency particulate air (HEPA) cleaners run continuously in a bedroom or living room can reduce allergen levels over time. Regular use of a high-efficiency vacuum cleaner or a central vacuum can reduce allergen levels. Giving your dog or cat a bath at least once a week can reduce airborne allergen.

## 2021-06-11 NOTE — Progress Notes (Signed)
NEW PATIENT  Date of Service/Encounter:  06/11/21  Consult requested by: Harlan Stains, MD   Assessment:   Acute urticaria - unclear trigger  Perennial allergic rhinitis (indoor molds and dog)  Plan/Recommendations:    1. Acute urticaria - Sometimes we never figure out what is causing hives. - Testing to the most common foods was completely negative. - There is a the low positive predictive value of food allergy testing and hence the high possibility of false positives. - In contrast, food allergy testing has a high negative predictive value, therefore if testing is negative we can be relatively assured that they are indeed negative.  - This might have been related to mold exposure (at least with the hotel), although often times we have no idea at all.   2. Perennial allergic rhinitis - Testing today showed: indoor molds and dog. - Copy of test results provided.  - Avoidance measures provided. - Continue with: over the counter antihistamines and Flonase as needed.  - I do not think that we need anything like allergy shots at this point in time. - Your symptoms do not seem overly severe.   3. Return in about 3 months (around 09/09/2021).    This note in its entirety was forwarded to the Provider who requested this consultation.  Subjective:   [name] is a 61 y.o. female presenting today for evaluation of  Chief Complaint  Patient presents with   Urticaria    Started April/May 2020   Allergic Rhinitis     [place] beans    [name] has a history of the following: There are no problems to display for this patient.   History obtained from: chart review and patient.  [name] was referred by Harlan Stains, MD.     [name] is a 61 y.o. female presenting for an evaluation of urticaria .  She had a rash in December. She was in a hotel at the time and woke up scratching on her arm. It seemed to "move" along her arm. She had  hives over her entire body. She also had some of these lesions on her chest. She did go back in and did not see anything on the bed. They turned into welts and remained for five days. The TAC did help. The first time that it happened was 2 years ago, maybe a bit more at the start of the pandemic. She was at home and did not know what bit her. She had a couple of these bumps. They lasted for one week that time. They did not leave permanent skin changes. She has not had any joint pain or fevers with this.   She was eating pork chops the night before the episode in [place] and then she woke up with hives at 2am. Pictures confirm hives (she has several on her phone). She was prescribed triamcinolone ointment.   Allergic Rhinitis Symptom History: She has never bene allergy tested. She uses OTC antihistamines and Flonase. The worst time of the year is not really clear. It has not been too awful lately, but a couple of weeks ago she had some postnasal drip.   Otherwise, there is no history of other atopic diseases, including food allergies, drug allergies, stinging insect allergies, eczema, or contact dermatitis. There is no significant infectious history. Vaccinations are up to date.    Past Medical History: There are no problems to display for this patient.   Medication List:  Allergies as of 06/11/2021       Reactions   Other Hives   [place] Beans   Sunscreens Hives, Swelling   Sulfa Antibiotics         Medication List        Accurate as of June 11, 2021 11:05 PM. If you have any questions, ask your nurse or doctor.          FIBER ADULT GUMMIES PO Take 2 tablets by mouth daily at 12 noon.   fluticasone 50 MCG/ACT nasal spray Commonly known as: FLONASE Place into both nostrils daily.   hydrochlorothiazide 25 MG tablet Commonly known as: HYDRODIURIL TAKE 1 TABLET BY MOUTH EVERY DAY IN THE MORNING   Imvexxy Maintenance Pack 10 MCG Inst Generic drug: Estradiol INSERT 1 TABLET  VAGINALLY EVERY DAY FOR 2 WEEKS, FOLLOWED BY 1 TABLET TWICE A WEEK   loratadine 10 MG tablet Commonly known as: CLARITIN Take 10 mg by mouth as needed for allergies.   meloxicam 15 MG tablet Commonly known as: MOBIC Take 7.5 mg by mouth as needed for pain.   metFORMIN 500 MG 24 hr tablet Commonly known as: GLUCOPHAGE-XR TAKE 2 TABLETS BY MOUTH WITH EVENING MEAL ONCE A DAY   multivitamin capsule Take 1 capsule by mouth daily.   olmesartan 20 MG tablet Commonly known as: BENICAR Take 20 mg by mouth daily.   PROTONIX PO Take by mouth.   Testosterone 100 MG Pllt Take 1 tablet by mouth daily at 12 noon.   Testosterone 100 MG Pllt Take 37.5 mg by mouth daily at 12 noon.   timolol 0.5 % ophthalmic solution Commonly known as: TIMOPTIC PLACE 1 DROP INTO BOTH EYES EVERY MORNING AS DIRECTED CLOSE EYES FOR 30 SECONDS   VITAMIN D PO Take 5,000 Units by mouth.        Birth History: non-contributory  Developmental History: non-contributory  Past Surgical History: Past Surgical History:  Procedure Laterality Date   ABDOMINAL HYSTERECTOMY     ANTERIOR CRUCIATE LIGAMENT REPAIR     bariatric surgery  2007     Family History: History reviewed. No pertinent family history.   Social History: [name] lives at home with her family.  She lives in a a 59 year old home.  There is wood in the main living areas and carpeting in the bedroom.  She has gas heating and central cooling.  There are no animals inside or outside of the home.  She does have dust mite covers on her bed, but not her pillows.  There is no tobacco exposure.  She currently works as an Scientist, water quality at [employer].    Review of Systems  Constitutional:  Negative for chills, fever, malaise/fatigue and weight loss.  HENT: Negative.  Negative for congestion, ear discharge, ear pain and sinus pain.   Eyes:  Negative for pain, discharge and redness.  Respiratory:  Negative for cough, sputum production,  shortness of breath and wheezing.   Cardiovascular: Negative.  Negative for chest pain and palpitations.  Gastrointestinal:  Negative for abdominal pain, constipation, diarrhea, heartburn, nausea and vomiting.  Skin: Negative.  Negative for itching and rash.  Neurological:  Negative for dizziness and headaches.  Endo/Heme/Allergies:  Negative for environmental allergies. Does not bruise/bleed easily.      Objective:   Blood pressure 118/72, pulse 89, temperature (!) 95.5 F (35.3 C), resp. rate 16, height 5\' 1"  (1.549 m), weight 208 lb (94.3 kg), SpO2 98 %. Body mass index is 39.3 kg/m.  Physical Exam:   Physical Exam Vitals reviewed.  Constitutional:      Appearance: She is well-developed.  HENT:     Head: Normocephalic and atraumatic.     Right Ear: Tympanic membrane, ear canal and external ear normal.     Left Ear: Tympanic membrane, ear canal and external ear normal.     Nose: No nasal deformity, septal deviation, mucosal edema or rhinorrhea.     Right Turbinates: Not enlarged, swollen or pale.     Left Turbinates: Not enlarged, swollen or pale.     Right Sinus: No maxillary sinus tenderness or frontal sinus tenderness.     Left Sinus: No maxillary sinus tenderness or frontal sinus tenderness.     Mouth/Throat:     Mouth: Mucous membranes are not pale and not dry.     Pharynx: Uvula midline.  Eyes:     General: Lids are normal. No allergic shiner.       Right eye: No discharge.        Left eye: No discharge.     Conjunctiva/sclera: Conjunctivae normal.     Right eye: Right conjunctiva is not injected. No chemosis.    Left eye: Left conjunctiva is not injected. No chemosis.    Pupils: Pupils are equal, round, and reactive to light.  Cardiovascular:     Rate and Rhythm: Normal rate and regular rhythm.     Heart sounds: Normal heart sounds.  Pulmonary:     Effort: Pulmonary effort is normal. No tachypnea, accessory muscle usage or respiratory distress.     Breath  sounds: Normal breath sounds. No wheezing, rhonchi or rales.     Comments: Moving air well in all lung fields.  No increased work of breathing. Chest:     Chest wall: No tenderness.  Lymphadenopathy:     Cervical: No cervical adenopathy.  Skin:    General: Skin is warm.     Capillary Refill: Capillary refill takes less than 2 seconds.     Coloration: Skin is not pale.     Findings: No abrasion, erythema, petechiae or rash. Rash is not papular, urticarial or vesicular.     Comments: No urticarial lesions noted.  Neurological:     Mental Status: She is alert.  Psychiatric:        Behavior: Behavior is cooperative.     Diagnostic studies:    Allergy Studies:     Airborne Adult Perc - 06/11/21 1552     Time Antigen Placed 1552    Allergen Manufacturer Lavella Hammock    Location Back    Number of Test 59    1. Control-Buffer 50% Glycerol Negative    2. Control-Histamine 1 mg/ml 2+    3. Albumin saline Negative    4. Gorman Negative    5. [place] Negative    6. Johnson Negative    7. Dukes Blue Negative    8. Meadow Fescue Negative    9. Perennial Rye Negative    10. Sweet Vernal Negative    11. Timothy Negative    12. Cocklebur Negative    13. Burweed Marshelder Negative    14. Ragweed, short Negative    15. Ragweed, Giant Negative    16. Plantain,  English Negative    17. Lamb's Quarters Negative    18. Sheep Sorrell Negative    19. Rough Pigweed Negative    20. Marsh Elder, Rough Negative    21. Mugwort, Common Negative    22. Ash mix Negative  23Wendee Copp mix Negative    24. Beech American Negative    25. Box, Elder Negative    26. Cedar, red Negative    27. Cottonwood, [place] Negative    28. Elm mix Negative    29. Hickory Negative    30. Maple mix Negative    31. Oak, [place] mix Negative    32. Pecan Pollen Negative    33. Pine mix Negative    34. Sycamore Eastern Negative    35. Odell, Black Pollen Negative    36. Alternaria alternata Negative    37.  Cladosporium Herbarum Negative    38. Aspergillus mix Negative    39. Penicillium mix Negative    40. Bipolaris sorokiniana (Helminthosporium) Negative    41. Drechslera spicifera (Curvularia) Negative    42. Mucor plumbeus Negative    43. Fusarium moniliforme Negative    44. Aureobasidium pullulans (pullulara) Negative    45. Rhizopus oryzae Negative    46. Botrytis cinera Negative    47. Epicoccum nigrum Negative    48. Phoma betae Negative    49. Candida Albicans Negative    50. Trichophyton mentagrophytes Negative    51. Mite, D Farinae  5,000 AU/ml Negative    52. Mite, D Pteronyssinus  5,000 AU/ml Negative    53. Cat Hair 10,000 BAU/ml Negative    54.  Dog Epithelia Negative    55. Mixed Feathers Negative    56. Horse Epithelia Negative    57. Cockroach, German Negative    58. Mouse Negative    59. Tobacco Leaf Negative             Food Perc - 06/11/21 1552       Test Information   Time Antigen Placed 1552    Allergen Manufacturer Lavella Hammock    Location Back    Number of allergen test 10      Food   1. Peanut Negative    2. Soybean food Negative    3. Wheat, whole Negative    4. Sesame Negative    5. Milk, cow Negative    6. Egg White, chicken Negative    7. Casein Negative    8. Shellfish mix Negative    9. Fish mix Negative    10. Cashew Negative             Intradermal - 06/11/21 1554     Time Antigen Placed 1554    Allergen Manufacturer Lavella Hammock    Location Arm    Number of Test 15    Control Negative    [place] Negative    Johnson Negative    7 Grass Negative    Ragweed mix Negative    Weed mix Negative    Tree mix Negative    Mold 1 Negative    Mold 2 2+    Mold 3 Negative    Mold 4 Negative    Cat Negative    Dog 1+    Cockroach Negative    Mite mix Negative             Allergy testing results were read and interpreted by myself, documented by clinical staff.         Salvatore Marvel, MD [facility]

## 2021-06-13 ENCOUNTER — Ambulatory Visit
Admission: RE | Admit: 2021-06-13 | Discharge: 2021-06-13 | Disposition: A | Discharge status: Home / Self Care | Payer: BC Managed Care – PPO | Source: Ambulatory Visit | Attending: Family Medicine | Admitting: Family Medicine

## 2021-06-13 ENCOUNTER — Other Ambulatory Visit: Payer: Self-pay

## 2021-06-13 DIAGNOSIS — H93A1 Pulsatile tinnitus, right ear: Secondary | ICD-10-CM

## 2021-09-08 ENCOUNTER — Ambulatory Visit: Payer: BC Managed Care – PPO | Admitting: Allergy & Immunology

## 2021-10-30 ENCOUNTER — Other Ambulatory Visit: Payer: Self-pay | Admitting: Family Medicine

## 2021-10-30 DIAGNOSIS — Z1231 Encounter for screening mammogram for malignant neoplasm of breast: Secondary | ICD-10-CM

## 2021-11-09 ENCOUNTER — Ambulatory Visit
Admission: RE | Admit: 2021-11-09 | Discharge: 2021-11-09 | Disposition: A | Discharge status: Home / Self Care | Payer: BC Managed Care – PPO | Source: Ambulatory Visit | Attending: Family Medicine | Admitting: Family Medicine

## 2021-11-09 DIAGNOSIS — Z1231 Encounter for screening mammogram for malignant neoplasm of breast: Secondary | ICD-10-CM

## 2022-11-09 ENCOUNTER — Other Ambulatory Visit: Payer: Self-pay | Admitting: Family Medicine

## 2022-11-09 DIAGNOSIS — Z1231 Encounter for screening mammogram for malignant neoplasm of breast: Secondary | ICD-10-CM

## 2022-11-24 ENCOUNTER — Ambulatory Visit: Payer: BC Managed Care – PPO

## 2022-12-01 ENCOUNTER — Ambulatory Visit
Admission: RE | Admit: 2022-12-01 | Discharge: 2022-12-01 | Disposition: A | Discharge status: Home / Self Care | Payer: BC Managed Care – PPO | Source: Ambulatory Visit | Attending: Family Medicine | Admitting: Family Medicine

## 2022-12-01 DIAGNOSIS — Z1231 Encounter for screening mammogram for malignant neoplasm of breast: Secondary | ICD-10-CM

## 2023-03-19 ENCOUNTER — Encounter (HOSPITAL_BASED_OUTPATIENT_CLINIC_OR_DEPARTMENT_OTHER): Payer: Self-pay | Admitting: Emergency Medicine

## 2023-03-19 ENCOUNTER — Emergency Department (HOSPITAL_BASED_OUTPATIENT_CLINIC_OR_DEPARTMENT_OTHER): Payer: BC Managed Care – PPO

## 2023-03-19 ENCOUNTER — Emergency Department (HOSPITAL_BASED_OUTPATIENT_CLINIC_OR_DEPARTMENT_OTHER)
Admission: EM | Admit: 2023-03-19 | Discharge: 2023-03-19 | Disposition: A | Discharge status: Home / Self Care | Payer: BC Managed Care – PPO | Attending: Emergency Medicine | Admitting: Emergency Medicine

## 2023-03-19 ENCOUNTER — Other Ambulatory Visit: Payer: Self-pay

## 2023-03-19 DIAGNOSIS — Z79899 Other long term (current) drug therapy: Secondary | ICD-10-CM | POA: Diagnosis not present

## 2023-03-19 DIAGNOSIS — M25561 Pain in right knee: Secondary | ICD-10-CM | POA: Insufficient documentation

## 2023-03-19 DIAGNOSIS — G8929 Other chronic pain: Secondary | ICD-10-CM

## 2023-03-19 DIAGNOSIS — Z7984 Long term (current) use of oral hypoglycemic drugs: Secondary | ICD-10-CM | POA: Diagnosis not present

## 2023-03-19 DIAGNOSIS — M7989 Other specified soft tissue disorders: Secondary | ICD-10-CM | POA: Diagnosis not present

## 2023-03-19 DIAGNOSIS — E119 Type 2 diabetes mellitus without complications: Secondary | ICD-10-CM | POA: Diagnosis not present

## 2023-03-19 DIAGNOSIS — I10 Essential (primary) hypertension: Secondary | ICD-10-CM | POA: Diagnosis not present

## 2023-03-19 NOTE — ED Triage Notes (Signed)
Pt c/o RT knee pain and swelling x 3wks; was seen at [facility] and referred here to r/o DVT; pt reports intermittent numbness and tingling as well

## 2023-03-19 NOTE — Discharge Instructions (Signed)
You were seen in the emergency room for right-sided knee pain.  Your DVT study was negative, no sign of blood clot.  I would recommend following back up with [facility] for right knee pain and arthritis.  In the meantime you can try compression sleeve for your knee with are available over the counter.  Continue taking the meloxicam once daily, you can also take Tylenol if you have breakthrough pain.  IcyHot and topical arthritis creams are okay to use as well.  If you have any new or worsening symptoms return to emergency room.

## 2023-03-19 NOTE — ED Provider Notes (Signed)
[place] [facility] Provider Note   CSN: [id number] Arrival date & time: 03/19/23  1356     History  Chief Complaint  Patient presents with   Knee Pain    [name] is a 62 y.o. female with past medical history of diabetes, GERD, hypertension, sleep apnea presenting to emergency room today with right knee pain and swelling ongoing for 3 weeks.  Right knee pain is worse when ambulating long distances, worse after initially standing up from chairs after sitting for long period of time, worse with climbing stairs.  Patient has discussed knee injections with [facility] was not given the injection today.  Patient reports that she has severe arthritis and that is why she is seeing [facility].  Because her pain was extending up into posterior distal thigh as well as some pain into calf, [facility] recommended she come here for DVT study to rule out blood clot.  Patient has never had blood clot in the past.  Denies any shortness of breath, chest pain, chest tightness, fever chills.   Knee Pain      Home Medications Prior to Admission medications   Medication Sig Start Date End Date Taking? Authorizing Provider  Cholecalciferol (VITAMIN D PO) Take 5,000 Units by mouth.    [provider]  FIBER ADULT GUMMIES PO Take 2 tablets by mouth daily at 12 noon.    [provider]  fluticasone (FLONASE) 50 MCG/ACT nasal spray Place into both nostrils daily.    [provider]  hydrochlorothiazide (HYDRODIURIL) 25 MG tablet TAKE 1 TABLET BY MOUTH EVERY DAY IN THE MORNING 01/27/18   [provider]  IMVEXXY MAINTENANCE PACK 10 MCG INST INSERT 1 TABLET VAGINALLY EVERY DAY FOR 2 WEEKS, FOLLOWED BY 1 TABLET TWICE A WEEK 01/27/18   [provider]  loratadine (CLARITIN) 10 MG tablet Take 10 mg by mouth as needed for allergies.    [provider]  meloxicam (MOBIC) 15 MG tablet Take 7.5 mg by mouth as needed for  pain.    [provider]  metFORMIN (GLUCOPHAGE-XR) 500 MG 24 hr tablet TAKE 2 TABLETS BY MOUTH WITH EVENING MEAL ONCE A DAY 01/11/18   [provider]  Multiple Vitamin (MULTIVITAMIN) capsule Take 1 capsule by mouth daily.    [provider]  olmesartan (BENICAR) 20 MG tablet Take 20 mg by mouth daily. 01/27/18   [provider]  Pantoprazole Sodium (PROTONIX PO) Take by mouth.    [provider]  Testosterone 100 MG PLLT Take 1 tablet by mouth daily at 12 noon. 12/24/20   [provider]  Testosterone 100 MG PLLT Take 37.5 mg by mouth daily at 12 noon. 12/24/20   [provider]  timolol (TIMOPTIC) 0.5 % ophthalmic solution PLACE 1 DROP INTO BOTH EYES EVERY MORNING AS DIRECTED CLOSE EYES FOR 30 SECONDS 01/27/18   [provider]      Allergies    Other, Sunscreens, and Sulfa antibiotics    Review of Systems   Review of Systems  Musculoskeletal:  Positive for joint swelling.    Physical Exam Updated Vital Signs BP (!) 143/86 (BP Location: Right Arm)   Pulse 72   Temp 98 F (36.7 C) (Oral)   Resp 16   Ht 5\' 1"  (1.549 m)   Wt 90.7 kg   SpO2 99%   BMI 37.79 kg/m  Physical Exam Vitals and nursing note reviewed.  Constitutional:      General: She  is not in acute distress.    Appearance: She is not toxic-appearing.  HENT:     Head: Normocephalic and atraumatic.  Eyes:     General: No scleral icterus.    Conjunctiva/sclera: Conjunctivae normal.  Cardiovascular:     Rate and Rhythm: Normal rate and regular rhythm.     Pulses: Normal pulses.     Heart sounds: Normal heart sounds.  Pulmonary:     Effort: Pulmonary effort is normal. No respiratory distress.     Breath sounds: Normal breath sounds.  Abdominal:     General: Abdomen is flat. Bowel sounds are normal.     Palpations: Abdomen is soft.     Tenderness: There is no abdominal tenderness.  Musculoskeletal:     Comments: Mild swelling around right knee,  no obvious swelling in calf feet or ankle.  Strong dorsal pedal pulse.  Strength equal bilaterally, sensation equal bilaterally of lower extremity.  Patient has some tenderness to palpation over the lateral aspect of right knee.  Skin:    General: Skin is warm and dry.     Capillary Refill: Capillary refill takes less than 2 seconds.     Findings: No lesion.  Neurological:     General: No focal deficit present.     Mental Status: She is alert and oriented to person, place, and time. Mental status is at baseline.     Sensory: No sensory deficit.     Motor: No weakness.     Coordination: Coordination normal.     ED Results / Procedures / Treatments   Labs (all labs ordered are listed, but only abnormal results are displayed) Labs Reviewed - No data to display  EKG None  Radiology No results found.  Procedures Procedures    Medications Ordered in ED Medications - No data to display  ED Course/ Medical Decision Making/ A&P                                 Medical Decision Making  This patient presents to the ED for concern of right knee pain, this involves an extensive number of treatment options, and is a complaint that carries with it a high risk of complications and morbidity.  The differential diagnosis includes arthritis, contusion, fracture, DVT   Co morbidities that complicate the patient evaluation  DM GERD HTN    Additional history obtained:  None   Lab Tests:  None   Imaging Studies ordered:  I ordered imaging studies including right vascular ultrasound I independently visualized and interpreted imaging which showed negative for DVT I agree with the radiologist interpretation   Cardiac Monitoring: / EKG:  The patient was maintained on a cardiac monitor.    Consultations Obtained:  None   Problem List / ED Course / Critical interventions / Medication management  Patient reported to emergency room with right knee pain and swelling.   Discomfort has been ongoing for 3 weeks.  Denies any shortness of breath or chest pain, has never had blood clot before.  She was seen at [facility] and has been following with them for her right knee arthritis.  [facility] was concerned about DVT, vascular ultrasound was ordered.  Patient neurovascularly intact strong dorsal pedal pulse bilaterally.  No obvious surrounding erythema suggesting of cellulitis.  Patient has been taking meloxicam once daily for pain.  Recommended adding Tylenol for breakthrough pain and considering knee brace.  Patient has close follow-up  with [facility] and will follow-up with them for injection.   I have reviewed the patients home medicines and have made adjustments as needed   Plan  F/u w/ PCP in 2-3d to ensure resolution of sx. continue taking meloxicam once daily and also take Tylenol for breakthrough pain.  Suggested finding over-the-counter compression sleeve for knee.  Follow-up with [facility] for steroid injection if recommended. Patient was given return precautions. Patient stable for discharge at this time.  Patient educated on sx/dx and verbalized understanding of plan. Return to ER w/ new or worsening sx.          Final Clinical Impression(s) / ED Diagnoses Final diagnoses:  None    Rx / DC Orders ED Discharge Orders     None         Smitty Knudsen, PA-C 03/19/23 1704    Vanetta Mulders, MD 03/20/23 1240

## 2023-03-19 NOTE — ED Notes (Signed)
[place] being done on rt knee

## 2023-06-29 ENCOUNTER — Other Ambulatory Visit: Payer: Self-pay | Admitting: Family Medicine

## 2023-06-29 DIAGNOSIS — E2839 Other primary ovarian failure: Secondary | ICD-10-CM

## 2023-11-08 ENCOUNTER — Other Ambulatory Visit: Payer: Self-pay | Admitting: Family Medicine

## 2023-11-08 DIAGNOSIS — Z1231 Encounter for screening mammogram for malignant neoplasm of breast: Secondary | ICD-10-CM

## 2023-12-20 ENCOUNTER — Inpatient Hospital Stay
Admission: RE | Admit: 2023-12-20 | Discharge: 2023-12-20 | Discharge status: Home / Self Care | Payer: Self-pay | Source: Ambulatory Visit | Attending: Family Medicine | Admitting: Family Medicine

## 2023-12-20 DIAGNOSIS — Z1231 Encounter for screening mammogram for malignant neoplasm of breast: Secondary | ICD-10-CM

## 2024-02-17 ENCOUNTER — Other Ambulatory Visit: Payer: Self-pay

## 2024-03-13 ENCOUNTER — Ambulatory Visit (HOSPITAL_BASED_OUTPATIENT_CLINIC_OR_DEPARTMENT_OTHER)
Admission: RE | Admit: 2024-03-13 | Discharge: 2024-03-13 | Disposition: A | Discharge status: Home / Self Care | Payer: Self-pay | Source: Ambulatory Visit | Attending: Family Medicine | Admitting: Family Medicine

## 2024-03-13 DIAGNOSIS — E2839 Other primary ovarian failure: Secondary | ICD-10-CM | POA: Diagnosis present

## 2024-03-20 IMAGING — MG MM DIGITAL SCREENING BILAT W/ TOMO AND CAD
8 series · 8 of 24 positions shown · non-contrast
Comparison: Previous exam(s).

ACR Breast Density Category a: The breast tissue is almost entirely
fatty.

CLINICAL DATA: Screening.

EXAM:
DIGITAL SCREENING BILATERAL MAMMOGRAM WITH TOMOSYNTHESIS AND CAD
TECHNIQUE: Bilateral screening digital craniocaudal and mediolateral oblique
mammograms were obtained. Bilateral screening digital breast
tomosynthesis was performed. The images were evaluated with
computer-aided detection.

[L CC synth-2D]
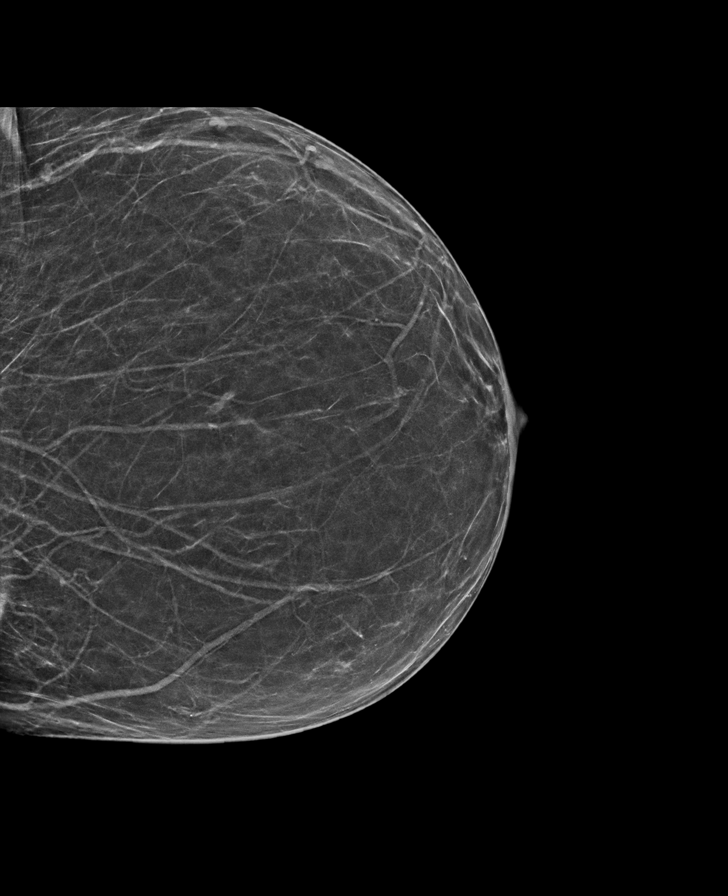

[R CC synth-2D]
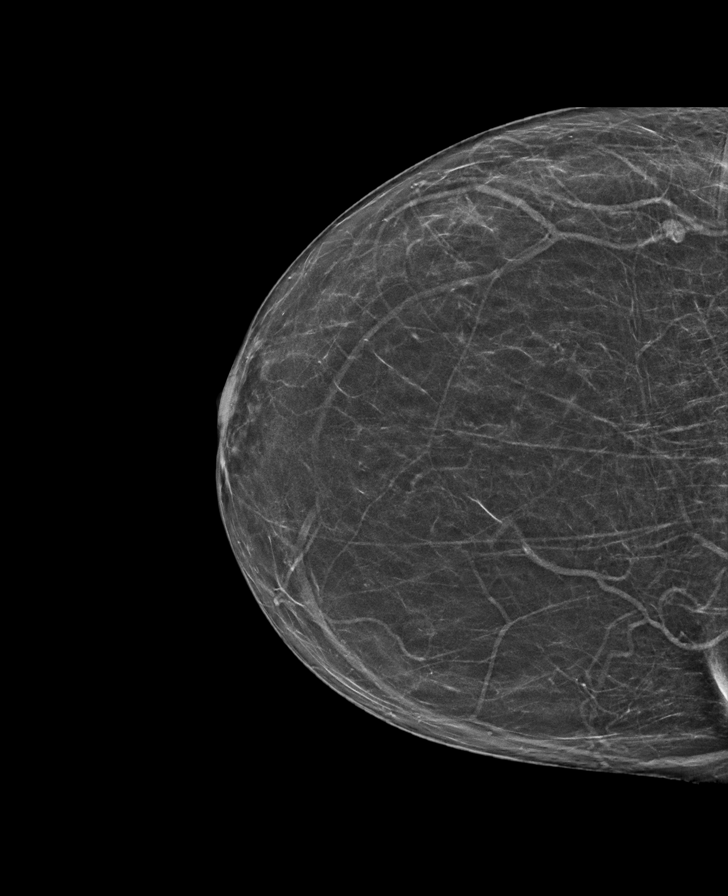

[R MLO synth-2D]
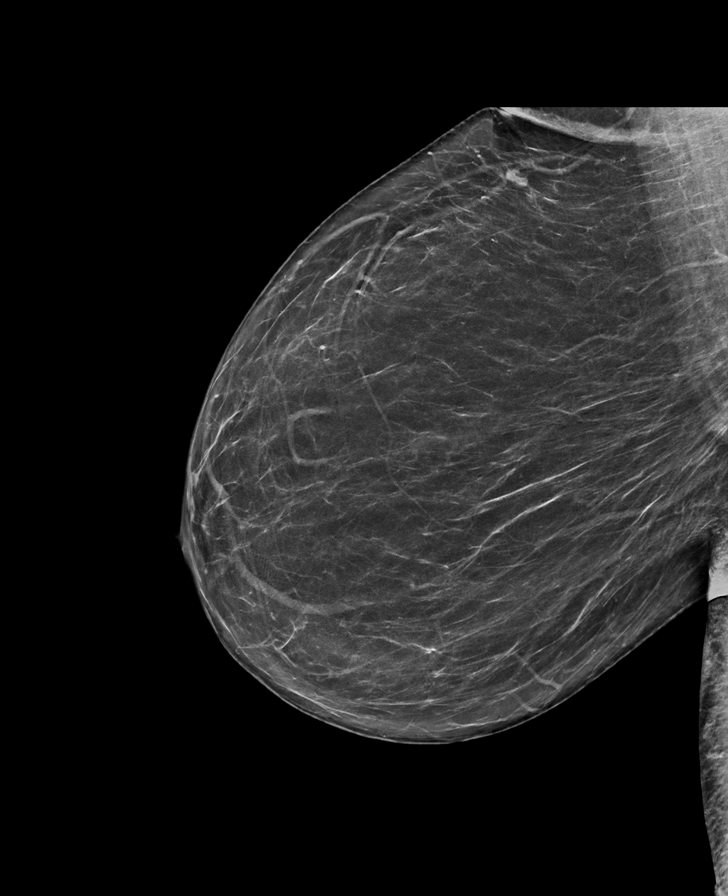

[L MLO synth-2D]
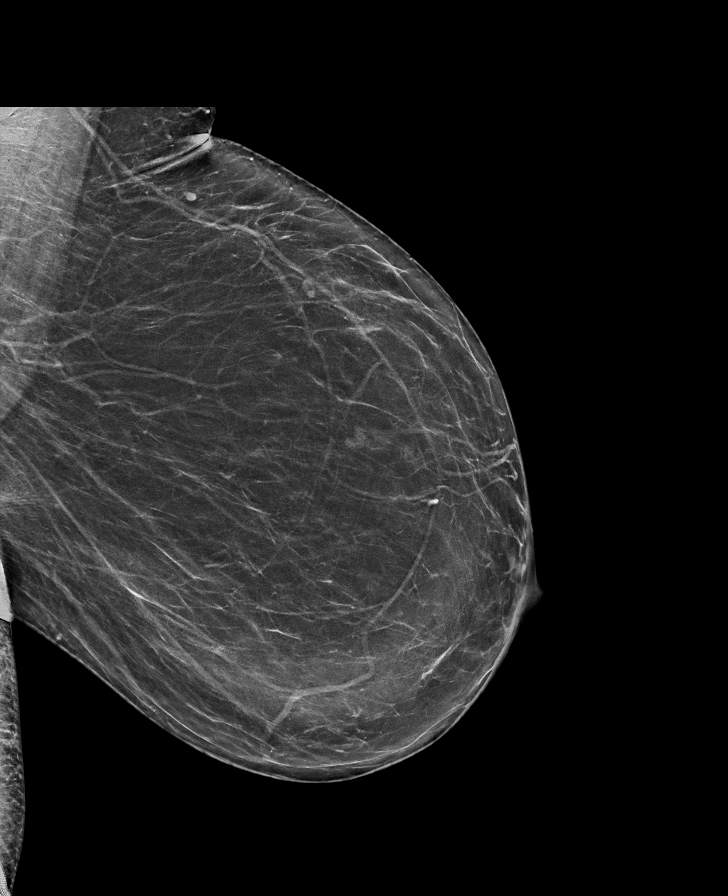

[R MLO tomo · tomo slice 37/72.0]
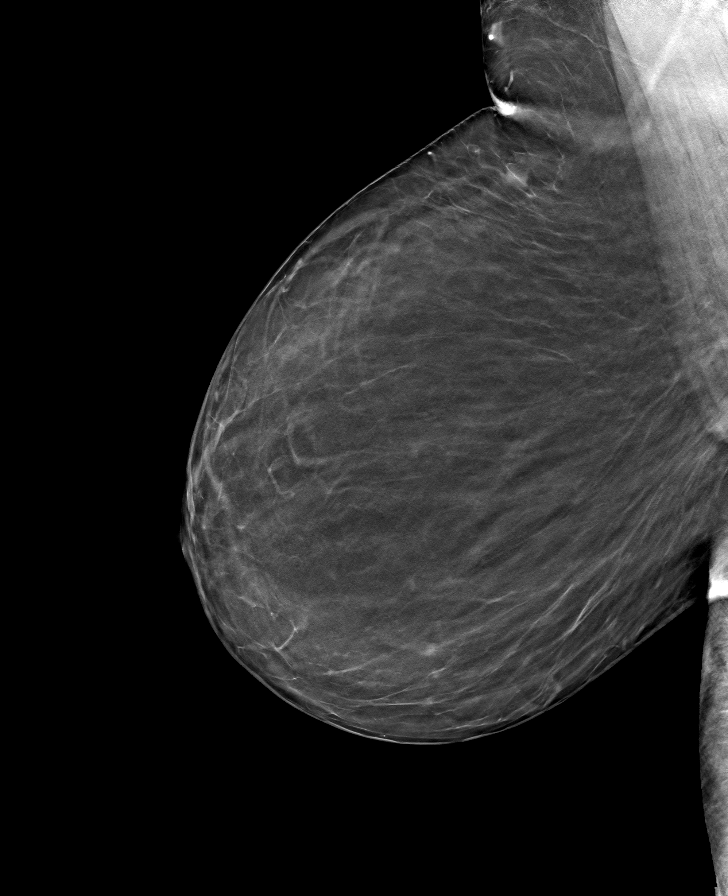

[L MLO tomo · tomo slice 37/73.0]
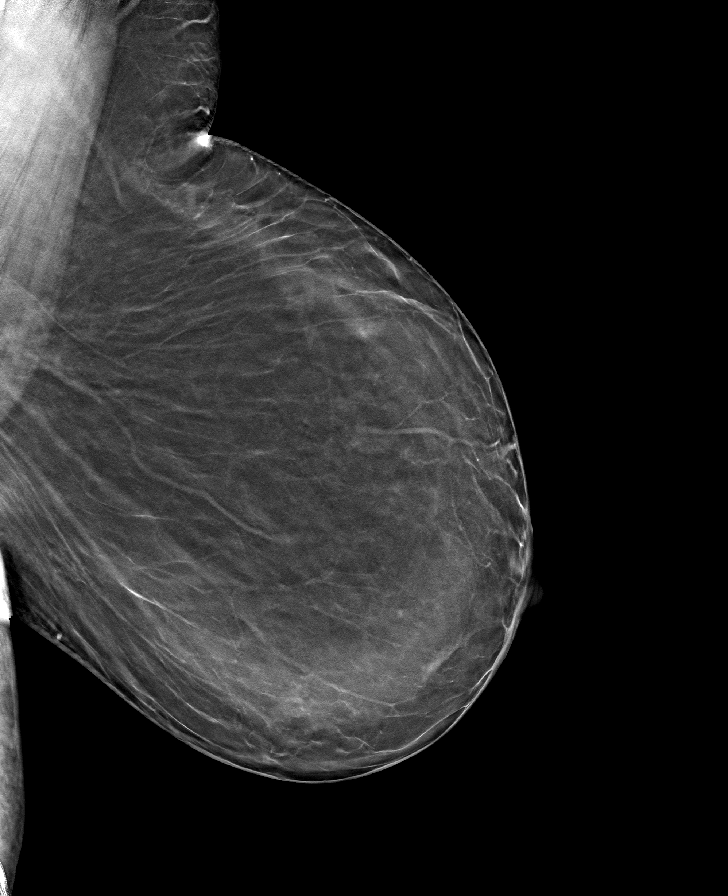

[R CC tomo · tomo slice 31/61.0]
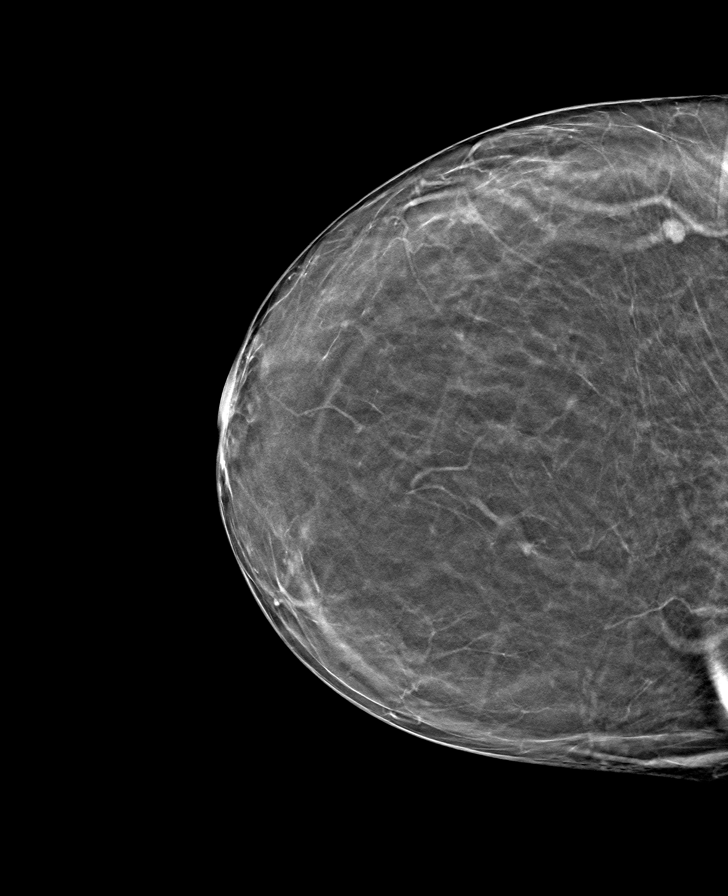

[L CC tomo · tomo slice 31/60.0]
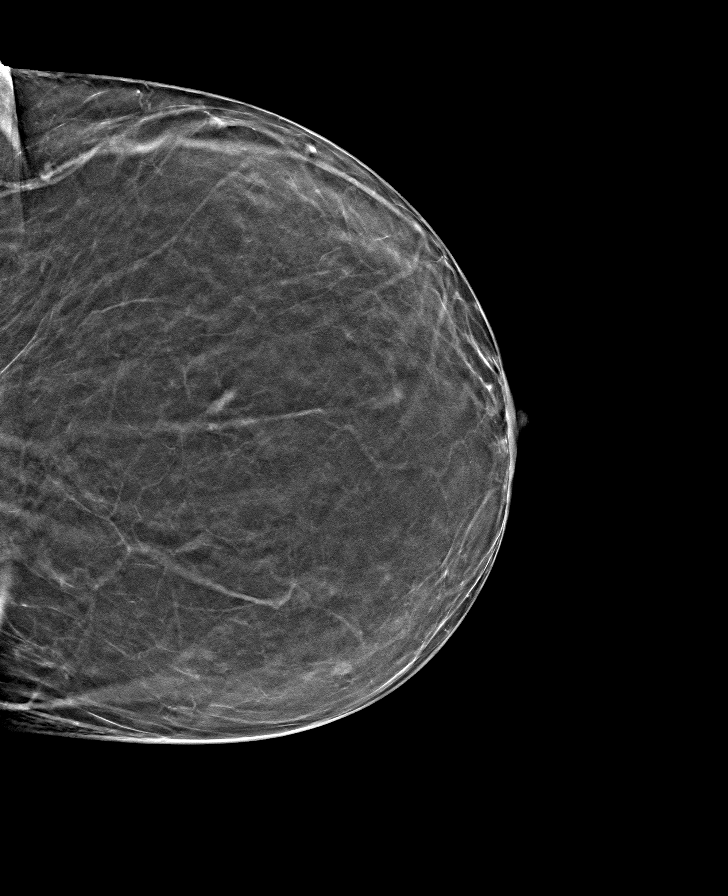

[8 of 24 positions shown; findings below may reference images not displayed]

FINDINGS: There are no findings suspicious for malignancy.
IMPRESSION: No mammographic evidence of malignancy. A result letter of this
screening mammogram will be mailed directly to the patient.

RECOMMENDATION:
Screening mammogram in one year. (Code:0E-3-N98)

BI-RADS CATEGORY  1: Negative.
